# Patient Record
Sex: Male | Born: 1975 | Race: White | Hispanic: No | Marital: Married | Smoking: Never smoker
Health system: Southern US, Community
[De-identification: ages and names within clinical notes are randomized; demographics above are authoritative.]

## PROBLEM LIST (undated history)

## (undated) DIAGNOSIS — R0602 Shortness of breath: Secondary | ICD-10-CM

## (undated) DIAGNOSIS — G809 Cerebral palsy, unspecified: Secondary | ICD-10-CM

## (undated) DIAGNOSIS — J189 Pneumonia, unspecified organism: Secondary | ICD-10-CM

## (undated) DIAGNOSIS — M1990 Unspecified osteoarthritis, unspecified site: Secondary | ICD-10-CM

## (undated) DIAGNOSIS — G8929 Other chronic pain: Secondary | ICD-10-CM

## (undated) DIAGNOSIS — K219 Gastro-esophageal reflux disease without esophagitis: Secondary | ICD-10-CM

## (undated) DIAGNOSIS — M549 Dorsalgia, unspecified: Secondary | ICD-10-CM

## (undated) DIAGNOSIS — N200 Calculus of kidney: Secondary | ICD-10-CM

## (undated) DIAGNOSIS — G709 Myoneural disorder, unspecified: Secondary | ICD-10-CM

## (undated) DIAGNOSIS — K649 Unspecified hemorrhoids: Secondary | ICD-10-CM

## (undated) DIAGNOSIS — G47419 Narcolepsy without cataplexy: Secondary | ICD-10-CM

## (undated) DIAGNOSIS — S060X9A Concussion with loss of consciousness of unspecified duration, initial encounter: Secondary | ICD-10-CM

## (undated) DIAGNOSIS — I639 Cerebral infarction, unspecified: Secondary | ICD-10-CM

## (undated) DIAGNOSIS — G43909 Migraine, unspecified, not intractable, without status migrainosus: Secondary | ICD-10-CM

## (undated) DIAGNOSIS — S060XAA Concussion with loss of consciousness status unknown, initial encounter: Secondary | ICD-10-CM

## (undated) DIAGNOSIS — G40909 Epilepsy, unspecified, not intractable, without status epilepticus: Secondary | ICD-10-CM

## (undated) HISTORY — PX: TENDON LENGTHENING: SHX395

## (undated) HISTORY — PX: APPENDECTOMY: SHX54

---

## 2005-09-29 HISTORY — PX: VASECTOMY: SHX75

## 2011-10-08 DIAGNOSIS — Z79899 Other long term (current) drug therapy: Secondary | ICD-10-CM | POA: Diagnosis not present

## 2011-10-08 DIAGNOSIS — M542 Cervicalgia: Secondary | ICD-10-CM | POA: Diagnosis not present

## 2011-10-08 DIAGNOSIS — S22009A Unspecified fracture of unspecified thoracic vertebra, initial encounter for closed fracture: Secondary | ICD-10-CM | POA: Diagnosis not present

## 2011-10-08 DIAGNOSIS — S139XXA Sprain of joints and ligaments of unspecified parts of neck, initial encounter: Secondary | ICD-10-CM | POA: Diagnosis not present

## 2011-10-08 DIAGNOSIS — R569 Unspecified convulsions: Secondary | ICD-10-CM | POA: Diagnosis not present

## 2011-10-09 DIAGNOSIS — S22009A Unspecified fracture of unspecified thoracic vertebra, initial encounter for closed fracture: Secondary | ICD-10-CM | POA: Diagnosis not present

## 2011-10-09 DIAGNOSIS — M545 Low back pain: Secondary | ICD-10-CM | POA: Diagnosis not present

## 2011-12-19 DIAGNOSIS — IMO0002 Reserved for concepts with insufficient information to code with codable children: Secondary | ICD-10-CM | POA: Diagnosis not present

## 2012-01-06 DIAGNOSIS — M546 Pain in thoracic spine: Secondary | ICD-10-CM | POA: Diagnosis not present

## 2012-03-22 DIAGNOSIS — G40209 Localization-related (focal) (partial) symptomatic epilepsy and epileptic syndromes with complex partial seizures, not intractable, without status epilepticus: Secondary | ICD-10-CM | POA: Diagnosis not present

## 2012-03-22 DIAGNOSIS — G43009 Migraine without aura, not intractable, without status migrainosus: Secondary | ICD-10-CM | POA: Diagnosis not present

## 2012-03-22 DIAGNOSIS — I6330 Cerebral infarction due to thrombosis of unspecified cerebral artery: Secondary | ICD-10-CM | POA: Diagnosis not present

## 2012-03-24 DIAGNOSIS — G40209 Localization-related (focal) (partial) symptomatic epilepsy and epileptic syndromes with complex partial seizures, not intractable, without status epilepticus: Secondary | ICD-10-CM | POA: Diagnosis not present

## 2012-03-24 DIAGNOSIS — I6330 Cerebral infarction due to thrombosis of unspecified cerebral artery: Secondary | ICD-10-CM | POA: Diagnosis not present

## 2012-03-24 DIAGNOSIS — G43009 Migraine without aura, not intractable, without status migrainosus: Secondary | ICD-10-CM | POA: Diagnosis not present

## 2012-06-09 DIAGNOSIS — M546 Pain in thoracic spine: Secondary | ICD-10-CM | POA: Diagnosis not present

## 2012-07-03 DIAGNOSIS — R110 Nausea: Secondary | ICD-10-CM | POA: Diagnosis not present

## 2012-07-03 DIAGNOSIS — M5090 Cervical disc disorder, unspecified, unspecified cervical region: Secondary | ICD-10-CM | POA: Diagnosis not present

## 2012-07-06 DIAGNOSIS — M542 Cervicalgia: Secondary | ICD-10-CM | POA: Diagnosis not present

## 2012-07-06 DIAGNOSIS — M546 Pain in thoracic spine: Secondary | ICD-10-CM | POA: Diagnosis not present

## 2012-07-06 DIAGNOSIS — M62838 Other muscle spasm: Secondary | ICD-10-CM | POA: Diagnosis not present

## 2012-07-06 DIAGNOSIS — G894 Chronic pain syndrome: Secondary | ICD-10-CM | POA: Diagnosis not present

## 2012-07-26 DIAGNOSIS — Z0000 Encounter for general adult medical examination without abnormal findings: Secondary | ICD-10-CM | POA: Diagnosis not present

## 2012-08-03 DIAGNOSIS — M629 Disorder of muscle, unspecified: Secondary | ICD-10-CM | POA: Diagnosis not present

## 2012-08-03 DIAGNOSIS — M546 Pain in thoracic spine: Secondary | ICD-10-CM | POA: Diagnosis not present

## 2012-08-03 DIAGNOSIS — G894 Chronic pain syndrome: Secondary | ICD-10-CM | POA: Diagnosis not present

## 2012-08-03 DIAGNOSIS — M542 Cervicalgia: Secondary | ICD-10-CM | POA: Diagnosis not present

## 2012-08-09 DIAGNOSIS — M542 Cervicalgia: Secondary | ICD-10-CM | POA: Diagnosis not present

## 2012-08-17 DIAGNOSIS — M542 Cervicalgia: Secondary | ICD-10-CM | POA: Diagnosis not present

## 2012-08-23 DIAGNOSIS — M25569 Pain in unspecified knee: Secondary | ICD-10-CM | POA: Diagnosis not present

## 2012-08-23 DIAGNOSIS — M6580 Other synovitis and tenosynovitis, unspecified site: Secondary | ICD-10-CM | POA: Diagnosis not present

## 2012-08-25 DIAGNOSIS — M25529 Pain in unspecified elbow: Secondary | ICD-10-CM | POA: Diagnosis not present

## 2012-08-25 DIAGNOSIS — M25519 Pain in unspecified shoulder: Secondary | ICD-10-CM | POA: Diagnosis not present

## 2012-09-01 DIAGNOSIS — M47817 Spondylosis without myelopathy or radiculopathy, lumbosacral region: Secondary | ICD-10-CM | POA: Diagnosis not present

## 2012-09-01 DIAGNOSIS — M542 Cervicalgia: Secondary | ICD-10-CM | POA: Diagnosis not present

## 2012-10-13 DIAGNOSIS — M5030 Other cervical disc degeneration, unspecified cervical region: Secondary | ICD-10-CM | POA: Diagnosis not present

## 2012-10-13 DIAGNOSIS — G894 Chronic pain syndrome: Secondary | ICD-10-CM | POA: Diagnosis not present

## 2012-10-13 DIAGNOSIS — M546 Pain in thoracic spine: Secondary | ICD-10-CM | POA: Diagnosis not present

## 2012-10-13 DIAGNOSIS — M545 Low back pain: Secondary | ICD-10-CM | POA: Diagnosis not present

## 2012-11-29 DIAGNOSIS — H40059 Ocular hypertension, unspecified eye: Secondary | ICD-10-CM | POA: Diagnosis not present

## 2012-12-28 DIAGNOSIS — M5030 Other cervical disc degeneration, unspecified cervical region: Secondary | ICD-10-CM | POA: Diagnosis not present

## 2013-01-04 DIAGNOSIS — M25519 Pain in unspecified shoulder: Secondary | ICD-10-CM | POA: Diagnosis not present

## 2013-01-17 DIAGNOSIS — M25519 Pain in unspecified shoulder: Secondary | ICD-10-CM | POA: Diagnosis not present

## 2013-01-18 ENCOUNTER — Encounter (HOSPITAL_COMMUNITY): Payer: Self-pay | Admitting: Pharmacy Technician

## 2013-01-18 DIAGNOSIS — M47812 Spondylosis without myelopathy or radiculopathy, cervical region: Principal | ICD-10-CM | POA: Diagnosis present

## 2013-01-18 NOTE — H&P (Signed)
History of Present Illness The patient is a 37 year old male who presents with neck pain. The patient reports symptoms involving the entire neck which began 1 year(s) ago. The symptoms began following a specific injury. The injury occurred due to a fall. Symptoms include neck pain, shoulder pain and arm numbness. The pain radiates to the left shoulder, left arm, right shoulder and right arm. The patient describes the pain as sharp and throbbing. Onset was gradual. The symptoms occur constantly.The patient does feel that the symptoms are worsening. Symptoms are exacerbated by neck movement. Symptoms are not relieved by ice or heat. Current treatment includes muscle relaxants. Prior to being seen today the patient was previously evaluated by a colleague (Dr. Ethelene Hal). Past evaluation has included cervical spine MRI. Past treatment has included spinal injections.  Subjective: Kristopher Valdez is a very pleasant 37 YO gentleman with an underlying history of cerebral palsy and seizure disorders. He has been having progressive midline neck pain, bilateral trapezial pain and left proximal arm pain for years. He's had previous shoulder dislocations on the left side. As a result of the persistent nature, he went to see Dr. Ethelene Hal. He's had P.T. in the past for his shoulders and his low back that only increased his pain. As a result, he had a cervical epidural steroid injection as well as cervical trigger point injections. He states they helped for a very short period of time (one day) so he was referred to me after the MRI was obtained.   He had an MRI done on 08-09-12 which demonstrated a moderate broad-based central disc protrusion on the left side at C4-5 causing displacement and compression of the left C5, and even somewhat the right C5 root.    Allergies No Known Allergies. 12/19/2011   Family History Cancer. grandmother mothers side Diabetes Mellitus. grandmother mothers side Heart Disease.  grandfather mothers side Heart disease in male family member before age 30 Hypertension. mother, father and grandfather mothers side Rheumatoid Arthritis. mother and grandmother mothers side   Social History Tobacco use. never smoker Marital status. married Drug/Alcohol Rehab (Currently). no Tobacco / smoke exposure. no Pain Contract. no Number of flights of stairs before winded. greater than 5 Living situation. live with spouse Illicit drug use. no Exercise. Exercises weekly; does other Current work status. disabled Children. 2 Drug/Alcohol Rehab (Previously). no   Medication History Robaxin-750 (750MG  Tablet, 1 (one) Tablet Oral two times daily, as needed, Taken starting 12/01/2012) Active. Percocet (10-325MG  Tablet, 1 (one) Tablet Oral every six hours, as needed, Taken starting 12/01/2012) Active. Depakote ER (500MG  Tablet ER 24HR, 3 Oral daily) Active. Vitamin C (500MG  Tablet, 1 (one) Oral as needed) Active.   Past Surgical History Appendectomy Other Orthopaedic Surgery Vasectomy Ankle Surgery. right   Other Problems Migraine Headache Other disease, cancer, significant illness Seizure Disorder Sleep Apnea  Exam:  Clinically he continues to have severe posterior neck pain, radiation into the scapula, and radiation over the lateral aspect of the upper arm and all of this is on the left side.  On clinical exam he is a pleasant gentleman who appears his started age in no acute distress. He is alert and oriented times three. No shortness of breath or chest pain. The lungs are clear to auscultation. No history of incontinence of bowel and bladder. Normal gait pattern. Negative Babinski test. Negative Hoffman's sign. No clonus. He has significant lateral deltoid pain with slight decreased sensation to light touch over the C5 dermatome. He has 4/5 deltoid strength  on the left side compared to the right. The rest of his left upper extremity is  within normal limits. He does have underlying CP, which affects his right side more prominently than his left, but there is no change on his right baseline exam.  RADIOGRAPHS:  The patient's MRI from November clearly shows C4-5 disc pathology with C5 left nerve root compression.  Although there is significant shoulder pathology, clinically he relates the largest percentage of his pain to his cervical C5 radiculopathy.    Plans Transcription(Kristopher Patella D Kristopher Coulon, MD; 12/31/2012 7:49 AM)  At this point in time, given the history of the recurrent dislocations and the pain with isolated shoulder ROM, before recommending any intervention for his neck, I'd like to at least get a better picture of his shoulder. I will order an MRI of his left shoulder to rule out a rotator cuff tear. Once we know the true extent of his pathology, then we can discuss potential intervention for his neck.  We have talked today about surgical intervention. He has had injection therapy, physical therapy, observation, and medications. His pain has gotten progressively worse since 2012 fall. At this point having tried and failed prolonged conservative management he would like to proceed with surgery. I think the best course of action is a single level ACDF.  The risks of that include infection, bleeding, nerve damage, death, stoke, paralysis, failure to heal, need for further surgery, ongoing or worse pain, nonunion, throat pain, swallowing difficulties, hoarseness in the voice, need for supplemental posterior surgery, also risk of adjacent segment problems, loss of bowel and bladder control, blood clots, adjacent segment disease.  All of his questions were addressed. We will plan on surgery in the near future.

## 2013-01-19 DIAGNOSIS — M542 Cervicalgia: Secondary | ICD-10-CM | POA: Diagnosis not present

## 2013-01-21 ENCOUNTER — Encounter (HOSPITAL_COMMUNITY)
Admission: RE | Admit: 2013-01-21 | Discharge: 2013-01-21 | Disposition: A | Discharge status: Home / Self Care | Payer: Medicare Other | Source: Ambulatory Visit | Attending: Orthopedic Surgery | Admitting: Orthopedic Surgery

## 2013-01-21 ENCOUNTER — Encounter (HOSPITAL_COMMUNITY): Payer: Self-pay

## 2013-01-21 DIAGNOSIS — M5020 Other cervical disc displacement, unspecified cervical region: Secondary | ICD-10-CM | POA: Diagnosis not present

## 2013-01-21 DIAGNOSIS — Z8261 Family history of arthritis: Secondary | ICD-10-CM | POA: Diagnosis not present

## 2013-01-21 DIAGNOSIS — Z9852 Vasectomy status: Secondary | ICD-10-CM | POA: Diagnosis not present

## 2013-01-21 DIAGNOSIS — Z01812 Encounter for preprocedural laboratory examination: Secondary | ICD-10-CM | POA: Diagnosis not present

## 2013-01-21 DIAGNOSIS — Z8249 Family history of ischemic heart disease and other diseases of the circulatory system: Secondary | ICD-10-CM | POA: Diagnosis not present

## 2013-01-21 DIAGNOSIS — Z79899 Other long term (current) drug therapy: Secondary | ICD-10-CM | POA: Diagnosis not present

## 2013-01-21 DIAGNOSIS — R0602 Shortness of breath: Secondary | ICD-10-CM | POA: Diagnosis not present

## 2013-01-21 DIAGNOSIS — M47812 Spondylosis without myelopathy or radiculopathy, cervical region: Secondary | ICD-10-CM | POA: Diagnosis not present

## 2013-01-21 DIAGNOSIS — G809 Cerebral palsy, unspecified: Secondary | ICD-10-CM | POA: Diagnosis not present

## 2013-01-21 DIAGNOSIS — M5000 Cervical disc disorder with myelopathy, unspecified cervical region: Secondary | ICD-10-CM | POA: Diagnosis not present

## 2013-01-21 DIAGNOSIS — G4730 Sleep apnea, unspecified: Secondary | ICD-10-CM | POA: Diagnosis present

## 2013-01-21 DIAGNOSIS — G8929 Other chronic pain: Secondary | ICD-10-CM | POA: Diagnosis present

## 2013-01-21 DIAGNOSIS — Z01818 Encounter for other preprocedural examination: Secondary | ICD-10-CM | POA: Diagnosis not present

## 2013-01-21 DIAGNOSIS — G43909 Migraine, unspecified, not intractable, without status migrainosus: Secondary | ICD-10-CM | POA: Diagnosis present

## 2013-01-21 DIAGNOSIS — Z8673 Personal history of transient ischemic attack (TIA), and cerebral infarction without residual deficits: Secondary | ICD-10-CM | POA: Diagnosis not present

## 2013-01-21 DIAGNOSIS — M539 Dorsopathy, unspecified: Secondary | ICD-10-CM | POA: Diagnosis not present

## 2013-01-21 DIAGNOSIS — G40909 Epilepsy, unspecified, not intractable, without status epilepticus: Secondary | ICD-10-CM | POA: Diagnosis not present

## 2013-01-21 DIAGNOSIS — Z833 Family history of diabetes mellitus: Secondary | ICD-10-CM | POA: Diagnosis not present

## 2013-01-21 DIAGNOSIS — K219 Gastro-esophageal reflux disease without esophagitis: Secondary | ICD-10-CM | POA: Diagnosis not present

## 2013-01-21 DIAGNOSIS — Z981 Arthrodesis status: Secondary | ICD-10-CM | POA: Diagnosis not present

## 2013-01-21 HISTORY — DX: Unspecified hemorrhoids: K64.9

## 2013-01-21 HISTORY — DX: Narcolepsy without cataplexy: G47.419

## 2013-01-21 HISTORY — DX: Concussion with loss of consciousness status unknown, initial encounter: S06.0XAA

## 2013-01-21 HISTORY — DX: Unspecified osteoarthritis, unspecified site: M19.90

## 2013-01-21 HISTORY — DX: Cerebral palsy, unspecified: G80.9

## 2013-01-21 HISTORY — DX: Gastro-esophageal reflux disease without esophagitis: K21.9

## 2013-01-21 HISTORY — DX: Shortness of breath: R06.02

## 2013-01-21 HISTORY — DX: Myoneural disorder, unspecified: G70.9

## 2013-01-21 HISTORY — DX: Cerebral infarction, unspecified: I63.9

## 2013-01-21 HISTORY — DX: Concussion with loss of consciousness of unspecified duration, initial encounter: S06.0X9A

## 2013-01-21 LAB — CBC
HCT: 42.1 % (ref 39.0–52.0)
MCHC: 35.2 g/dL (ref 30.0–36.0)
Platelets: 191 10*3/uL (ref 150–400)
RDW: 11.7 % (ref 11.5–15.5)
WBC: 5.7 10*3/uL (ref 4.0–10.5)

## 2013-01-21 LAB — SURGICAL PCR SCREEN
MRSA, PCR: NEGATIVE
Staphylococcus aureus: POSITIVE — AB

## 2013-01-21 NOTE — Pre-Procedure Instructions (Addendum)
CHRISTEN BEDOYA  01/21/2013   Your procedure is scheduled on:  Wednesday, April 30th.  Report to [facility] at 9:30AM.  Call this number if you have problems the morning of surgery: [phone]   Remember:   Do not eat food or drink liquids after midnight.   Take these medicines the morning of surgery with A SIP OF WATER:  divalproex (DEPAKOTE  If needed: HYDROcodone-acetaminophen (NORCO) methocarbamol (ROBAXIN)   oxyCODONE-acetaminophen (PERCOCET)     Do not wear jewelry, make-up or nail polish.  Do not wear lotions, powders, or perfumes. You may wear deodorant.  Men may shave face and neck.  Do not bring valuables to the hospital.  Contacts, dentures or bridgework may not be worn into surgery.  Leave suitcase in the car. After surgery it may be brought to your room.  For patients admitted to the hospital, checkout time is 11:00 AM the day of discharge.   Patients discharged the day of surgery will not be allowed to drive home.  Name and phone number of your driver: -  Special Instructions: Shower using CHG 2 nights before surgery and the night before surgery.  If you shower the day of surgery use CHG.  Use special wash - you have one bottle of CHG for all showers.  You should use approximately 1/3 of the bottle for each shower.  Bring Brace with you to the hospital.   Please read over the following fact sheets that you were given: Pain Booklet, Coughing and Deep Breathing and Surgical Site Infection Prevention

## 2013-01-25 MED ORDER — CEFAZOLIN SODIUM-DEXTROSE 2-3 GM-% IV SOLR
2.0000 g | INTRAVENOUS | Status: AC
  Administered 2013-01-26: 2 g via INTRAVENOUS
  Filled 2013-01-25: qty 50

## 2013-01-25 MED ORDER — DEXAMETHASONE SODIUM PHOSPHATE 4 MG/ML IJ SOLN
4.0000 mg | Freq: Once | INTRAMUSCULAR | Status: DC
  Filled 2013-01-25: qty 1

## 2013-01-25 MED ORDER — ACETAMINOPHEN 10 MG/ML IV SOLN
1000.0000 mg | Freq: Once | INTRAVENOUS | Status: AC
  Administered 2013-01-26: 1000 mg via INTRAVENOUS
  Filled 2013-01-25: qty 100

## 2013-01-26 ENCOUNTER — Ambulatory Visit (HOSPITAL_COMMUNITY): Payer: Medicare Other

## 2013-01-26 ENCOUNTER — Encounter (HOSPITAL_COMMUNITY): Payer: Self-pay | Admitting: Anesthesiology

## 2013-01-26 ENCOUNTER — Ambulatory Visit (HOSPITAL_COMMUNITY): Payer: Medicare Other | Admitting: Anesthesiology

## 2013-01-26 ENCOUNTER — Inpatient Hospital Stay (HOSPITAL_COMMUNITY)
Admission: RE | Admit: 2013-01-26 | Discharge: 2013-01-27 | DRG: 473 | Disposition: A | Discharge status: Home / Self Care | Payer: Medicare Other | Source: Ambulatory Visit | Attending: Orthopedic Surgery | Admitting: Orthopedic Surgery

## 2013-01-26 ENCOUNTER — Encounter (HOSPITAL_COMMUNITY)
Admission: RE | Disposition: A | Discharge status: Home / Self Care | Payer: Self-pay | Source: Ambulatory Visit | Attending: Orthopedic Surgery

## 2013-01-26 ENCOUNTER — Encounter (HOSPITAL_COMMUNITY): Payer: Self-pay | Admitting: *Deleted

## 2013-01-26 DIAGNOSIS — G40909 Epilepsy, unspecified, not intractable, without status epilepticus: Secondary | ICD-10-CM | POA: Diagnosis present

## 2013-01-26 DIAGNOSIS — M47812 Spondylosis without myelopathy or radiculopathy, cervical region: Principal | ICD-10-CM | POA: Diagnosis present

## 2013-01-26 DIAGNOSIS — Z833 Family history of diabetes mellitus: Secondary | ICD-10-CM

## 2013-01-26 DIAGNOSIS — M539 Dorsopathy, unspecified: Secondary | ICD-10-CM | POA: Diagnosis not present

## 2013-01-26 DIAGNOSIS — M5000 Cervical disc disorder with myelopathy, unspecified cervical region: Secondary | ICD-10-CM | POA: Diagnosis not present

## 2013-01-26 DIAGNOSIS — R0602 Shortness of breath: Secondary | ICD-10-CM | POA: Diagnosis not present

## 2013-01-26 DIAGNOSIS — G8929 Other chronic pain: Secondary | ICD-10-CM | POA: Diagnosis present

## 2013-01-26 DIAGNOSIS — K219 Gastro-esophageal reflux disease without esophagitis: Secondary | ICD-10-CM | POA: Diagnosis not present

## 2013-01-26 DIAGNOSIS — Z79899 Other long term (current) drug therapy: Secondary | ICD-10-CM

## 2013-01-26 DIAGNOSIS — G809 Cerebral palsy, unspecified: Secondary | ICD-10-CM | POA: Diagnosis not present

## 2013-01-26 DIAGNOSIS — G4730 Sleep apnea, unspecified: Secondary | ICD-10-CM | POA: Diagnosis present

## 2013-01-26 DIAGNOSIS — Z01818 Encounter for other preprocedural examination: Secondary | ICD-10-CM | POA: Diagnosis not present

## 2013-01-26 DIAGNOSIS — Z8673 Personal history of transient ischemic attack (TIA), and cerebral infarction without residual deficits: Secondary | ICD-10-CM

## 2013-01-26 DIAGNOSIS — Z981 Arthrodesis status: Secondary | ICD-10-CM | POA: Diagnosis not present

## 2013-01-26 DIAGNOSIS — Z8261 Family history of arthritis: Secondary | ICD-10-CM

## 2013-01-26 DIAGNOSIS — Z01812 Encounter for preprocedural laboratory examination: Secondary | ICD-10-CM

## 2013-01-26 DIAGNOSIS — Z8249 Family history of ischemic heart disease and other diseases of the circulatory system: Secondary | ICD-10-CM

## 2013-01-26 DIAGNOSIS — M5020 Other cervical disc displacement, unspecified cervical region: Secondary | ICD-10-CM | POA: Diagnosis not present

## 2013-01-26 DIAGNOSIS — Z9852 Vasectomy status: Secondary | ICD-10-CM

## 2013-01-26 DIAGNOSIS — G43909 Migraine, unspecified, not intractable, without status migrainosus: Secondary | ICD-10-CM | POA: Diagnosis present

## 2013-01-26 HISTORY — DX: Migraine, unspecified, not intractable, without status migrainosus: G43.909

## 2013-01-26 HISTORY — DX: Epilepsy, unspecified, not intractable, without status epilepticus: G40.909

## 2013-01-26 HISTORY — DX: Calculus of kidney: N20.0

## 2013-01-26 HISTORY — PX: ANTERIOR CERVICAL DECOMP/DISCECTOMY FUSION: SHX1161

## 2013-01-26 HISTORY — DX: Pneumonia, unspecified organism: J18.9

## 2013-01-26 HISTORY — DX: Dorsalgia, unspecified: M54.9

## 2013-01-26 HISTORY — DX: Other chronic pain: G89.29

## 2013-01-26 LAB — BASIC METABOLIC PANEL
CO2: 27 mEq/L (ref 19–32)
GFR calc non Af Amer: >90 mL/min (ref >90)
Glucose, Bld: 88 mg/dL (ref 70–99)
Potassium: 4.4 mEq/L (ref 3.5–5.1)
Sodium: 138 mEq/L (ref 135–145)

## 2013-01-26 SURGERY — ANTERIOR CERVICAL DECOMPRESSION/DISCECTOMY FUSION 1 LEVEL
Anesthesia: General | Site: Neck

## 2013-01-26 MED ORDER — DEXAMETHASONE SODIUM PHOSPHATE 4 MG/ML IJ SOLN
4.0000 mg | Freq: Four times a day (QID) | INTRAMUSCULAR | Status: DC
  Filled 2013-01-26 (×7): qty 1

## 2013-01-26 MED ORDER — OXYCODONE-ACETAMINOPHEN 10-325 MG PO TABS: 1.0000 | ORAL_TABLET | ORAL | Status: DC | PRN

## 2013-01-26 MED ORDER — METHOCARBAMOL 500 MG PO TABS: 500.0000 mg | ORAL_TABLET | Freq: Three times a day (TID) | ORAL | Status: DC | PRN

## 2013-01-26 MED ORDER — MORPHINE SULFATE 2 MG/ML IJ SOLN
1.0000 mg | INTRAMUSCULAR | Status: DC | PRN
  Administered 2013-01-26: 2 mg via INTRAVENOUS
  Filled 2013-01-26: qty 1

## 2013-01-26 MED ORDER — MIDAZOLAM HCL 5 MG/5ML IJ SOLN
INTRAMUSCULAR | Status: DC | PRN
  Administered 2013-01-26: 2 mg via INTRAVENOUS

## 2013-01-26 MED ORDER — DEXAMETHASONE SODIUM PHOSPHATE 10 MG/ML IJ SOLN
INTRAMUSCULAR | Status: AC
  Administered 2013-01-26: 10 mg via INTRAVENOUS
  Filled 2013-01-26: qty 1

## 2013-01-26 MED ORDER — NEOSTIGMINE METHYLSULFATE 1 MG/ML IJ SOLN
INTRAMUSCULAR | Status: DC | PRN
  Administered 2013-01-26: 3 mg via INTRAVENOUS

## 2013-01-26 MED ORDER — LACTATED RINGERS IV SOLN
INTRAVENOUS | Status: DC
  Administered 2013-01-26: 11:00:00 via INTRAVENOUS

## 2013-01-26 MED ORDER — ONDANSETRON HCL 4 MG PO TABS: 4.0000 mg | ORAL_TABLET | Freq: Three times a day (TID) | ORAL | Status: DC | PRN

## 2013-01-26 MED ORDER — POLYETHYLENE GLYCOL 3350 17 GM/SCOOP PO POWD: 17.0000 g | Freq: Every day | ORAL | Status: DC

## 2013-01-26 MED ORDER — 0.9 % SODIUM CHLORIDE (POUR BTL) OPTIME
TOPICAL | Status: DC | PRN
  Administered 2013-01-26: 1000 mL

## 2013-01-26 MED ORDER — BUPIVACAINE-EPINEPHRINE 0.25% -1:200000 IJ SOLN
INTRAMUSCULAR | Status: DC | PRN
  Administered 2013-01-26: 3 mL

## 2013-01-26 MED ORDER — ROCURONIUM BROMIDE 100 MG/10ML IV SOLN
INTRAVENOUS | Status: DC | PRN
  Administered 2013-01-26: 50 mg via INTRAVENOUS

## 2013-01-26 MED ORDER — HYDROMORPHONE HCL PF 1 MG/ML IJ SOLN
INTRAMUSCULAR | Status: AC
  Filled 2013-01-26: qty 1

## 2013-01-26 MED ORDER — SODIUM CHLORIDE 0.9 % IJ SOLN
3.0000 mL | Freq: Two times a day (BID) | INTRAMUSCULAR | Status: DC
  Administered 2013-01-27: 3 mL via INTRAVENOUS

## 2013-01-26 MED ORDER — LACTATED RINGERS IV SOLN: INTRAVENOUS | Status: DC

## 2013-01-26 MED ORDER — SODIUM CHLORIDE 0.9 % IJ SOLN: 3.0000 mL | INTRAMUSCULAR | Status: DC | PRN

## 2013-01-26 MED ORDER — GLYCOPYRROLATE 0.2 MG/ML IJ SOLN
INTRAMUSCULAR | Status: DC | PRN
  Administered 2013-01-26: 0.4 mg via INTRAVENOUS

## 2013-01-26 MED ORDER — OXYCODONE HCL 5 MG/5ML PO SOLN: 5.0000 mg | Freq: Once | ORAL | Status: DC | PRN

## 2013-01-26 MED ORDER — HYDROMORPHONE HCL PF 1 MG/ML IJ SOLN
0.2500 mg | INTRAMUSCULAR | Status: DC | PRN
  Administered 2013-01-26 (×2): 0.5 mg via INTRAVENOUS

## 2013-01-26 MED ORDER — ONDANSETRON HCL 4 MG/2ML IJ SOLN
INTRAMUSCULAR | Status: DC | PRN
  Administered 2013-01-26: 4 mg via INTRAVENOUS

## 2013-01-26 MED ORDER — PHENYLEPHRINE HCL 10 MG/ML IJ SOLN
INTRAMUSCULAR | Status: DC | PRN
  Administered 2013-01-26 (×2): 80 ug via INTRAVENOUS

## 2013-01-26 MED ORDER — LIDOCAINE HCL (CARDIAC) 20 MG/ML IV SOLN
INTRAVENOUS | Status: DC | PRN
  Administered 2013-01-26: 50 mg via INTRAVENOUS

## 2013-01-26 MED ORDER — THROMBIN 20000 UNITS EX SOLR
OROMUCOSAL | Status: DC | PRN
  Administered 2013-01-26: 12:00:00 via TOPICAL

## 2013-01-26 MED ORDER — METOCLOPRAMIDE HCL 5 MG/ML IJ SOLN: 10.0000 mg | Freq: Once | INTRAMUSCULAR | Status: DC | PRN

## 2013-01-26 MED ORDER — ACETAMINOPHEN 10 MG/ML IV SOLN
INTRAVENOUS | Status: AC
  Filled 2013-01-26: qty 100

## 2013-01-26 MED ORDER — FENTANYL CITRATE 0.05 MG/ML IJ SOLN
INTRAMUSCULAR | Status: DC | PRN
  Administered 2013-01-26: 50 ug via INTRAVENOUS
  Administered 2013-01-26: 100 ug via INTRAVENOUS
  Administered 2013-01-26: 50 ug via INTRAVENOUS

## 2013-01-26 MED ORDER — THROMBIN 20000 UNITS EX SOLR
CUTANEOUS | Status: DC | PRN
  Administered 2013-01-26: 20000 [IU] via TOPICAL

## 2013-01-26 MED ORDER — OXYCODONE HCL 5 MG PO TABS: 5.0000 mg | ORAL_TABLET | Freq: Once | ORAL | Status: DC | PRN

## 2013-01-26 MED ORDER — ZOLPIDEM TARTRATE 5 MG PO TABS: 5.0000 mg | ORAL_TABLET | Freq: Every evening | ORAL | Status: DC | PRN

## 2013-01-26 MED ORDER — THROMBIN 20000 UNITS EX SOLR
CUTANEOUS | Status: AC
  Filled 2013-01-26: qty 20000

## 2013-01-26 MED ORDER — CEFAZOLIN SODIUM 1-5 GM-% IV SOLN
1.0000 g | Freq: Three times a day (TID) | INTRAVENOUS | Status: AC
  Administered 2013-01-26 – 2013-01-27 (×2): 1 g via INTRAVENOUS
  Filled 2013-01-26 (×3): qty 50

## 2013-01-26 MED ORDER — LACTATED RINGERS IV SOLN
INTRAVENOUS | Status: DC | PRN
  Administered 2013-01-26 (×2): via INTRAVENOUS

## 2013-01-26 MED ORDER — BUPIVACAINE-EPINEPHRINE PF 0.25-1:200000 % IJ SOLN
INTRAMUSCULAR | Status: AC
  Filled 2013-01-26: qty 30

## 2013-01-26 MED ORDER — LIDOCAINE HCL 4 % MT SOLN
OROMUCOSAL | Status: DC | PRN
  Administered 2013-01-26: 4 mL via TOPICAL

## 2013-01-26 MED ORDER — EPHEDRINE SULFATE 50 MG/ML IJ SOLN
INTRAMUSCULAR | Status: DC | PRN
  Administered 2013-01-26: 10 mg via INTRAVENOUS

## 2013-01-26 MED ORDER — SODIUM CHLORIDE 0.9 % IV SOLN: 250.0000 mL | INTRAVENOUS | Status: DC

## 2013-01-26 MED ORDER — DEXAMETHASONE 4 MG PO TABS
4.0000 mg | ORAL_TABLET | Freq: Four times a day (QID) | ORAL | Status: DC
  Administered 2013-01-26 – 2013-01-27 (×4): 4 mg via ORAL
  Filled 2013-01-26 (×7): qty 1

## 2013-01-26 MED ORDER — OXYCODONE HCL 5 MG PO TABS
10.0000 mg | ORAL_TABLET | ORAL | Status: DC | PRN
  Administered 2013-01-27: 10 mg via ORAL
  Filled 2013-01-26: qty 2

## 2013-01-26 MED ORDER — PROPOFOL 10 MG/ML IV BOLUS
INTRAVENOUS | Status: DC | PRN
  Administered 2013-01-26: 200 mg via INTRAVENOUS

## 2013-01-26 MED ORDER — DIVALPROEX SODIUM 500 MG PO DR TAB
500.0000 mg | DELAYED_RELEASE_TABLET | Freq: Three times a day (TID) | ORAL | Status: DC
  Administered 2013-01-26 – 2013-01-27 (×2): 500 mg via ORAL
  Filled 2013-01-26 (×4): qty 1

## 2013-01-26 MED ORDER — METHOCARBAMOL 100 MG/ML IJ SOLN
500.0000 mg | Freq: Four times a day (QID) | INTRAVENOUS | Status: DC | PRN
  Filled 2013-01-26: qty 5

## 2013-01-26 MED ORDER — ACETAMINOPHEN 10 MG/ML IV SOLN
1000.0000 mg | Freq: Four times a day (QID) | INTRAVENOUS | Status: DC
Start: 2013-01-26 — End: 2013-01-27
  Administered 2013-01-26 – 2013-01-27 (×3): 1000 mg via INTRAVENOUS
  Filled 2013-01-26 (×4): qty 100

## 2013-01-26 MED ORDER — PHENOL 1.4 % MT LIQD
1.0000 | OROMUCOSAL | Status: DC | PRN
  Administered 2013-01-27: 1 via OROMUCOSAL
  Filled 2013-01-26: qty 177

## 2013-01-26 MED ORDER — DOCUSATE SODIUM 100 MG PO CAPS: 100.0000 mg | ORAL_CAPSULE | Freq: Three times a day (TID) | ORAL | Status: DC | PRN

## 2013-01-26 MED ORDER — MENTHOL 3 MG MT LOZG: 1.0000 | LOZENGE | OROMUCOSAL | Status: DC | PRN

## 2013-01-26 MED ORDER — ONDANSETRON HCL 4 MG/2ML IJ SOLN
4.0000 mg | INTRAMUSCULAR | Status: DC | PRN
  Administered 2013-01-26: 4 mg via INTRAVENOUS
  Filled 2013-01-26: qty 2

## 2013-01-26 MED ORDER — METHOCARBAMOL 500 MG PO TABS
500.0000 mg | ORAL_TABLET | Freq: Four times a day (QID) | ORAL | Status: DC | PRN
  Administered 2013-01-27: 500 mg via ORAL
  Filled 2013-01-26: qty 1

## 2013-01-26 MED ORDER — MIDAZOLAM HCL 5 MG/5ML IJ SOLN: INTRAMUSCULAR | Status: DC | PRN

## 2013-01-26 SURGICAL SUPPLY — 53 items
ACIS DISTRACTION PINS 12MM ×4 IMPLANT
BLADE SURG ROTATE 9660 (MISCELLANEOUS) IMPLANT
BUR EGG ELITE 4.0 (BURR) IMPLANT
BUR MATCHSTICK NEURO 3.0 LAGG (BURR) IMPLANT
CANISTER SUCTION 2500CC (MISCELLANEOUS) ×2 IMPLANT
CLOTH BEACON ORANGE TIMEOUT ST (SAFETY) ×2 IMPLANT
CLSR STERI-STRIP ANTIMIC 1/2X4 (GAUZE/BANDAGES/DRESSINGS) ×2 IMPLANT
CORDS BIPOLAR (ELECTRODE) ×2 IMPLANT
COVER SURGICAL LIGHT HANDLE (MISCELLANEOUS) ×4 IMPLANT
CRADLE DONUT ADULT HEAD (MISCELLANEOUS) ×2 IMPLANT
DRAPE C-ARM 42X72 X-RAY (DRAPES) ×2 IMPLANT
DRAPE POUCH INSTRU U-SHP 10X18 (DRAPES) ×2 IMPLANT
DRAPE SURG 17X23 STRL (DRAPES) ×2 IMPLANT
DRAPE U-SHAPE 47X51 STRL (DRAPES) ×2 IMPLANT
DRSG MEPILEX BORDER 4X4 (GAUZE/BANDAGES/DRESSINGS) ×2 IMPLANT
DURAPREP 26ML APPLICATOR (WOUND CARE) ×2 IMPLANT
ELECT COATED BLADE 2.86 ST (ELECTRODE) ×2 IMPLANT
ELECT REM PT RETURN 9FT ADLT (ELECTROSURGICAL) ×2
ELECTRODE REM PT RTRN 9FT ADLT (ELECTROSURGICAL) ×1 IMPLANT
GLOVE BIOGEL PI IND STRL 8.5 (GLOVE) ×1 IMPLANT
GLOVE BIOGEL PI INDICATOR 8.5 (GLOVE) ×1
GLOVE ECLIPSE 8.5 STRL (GLOVE) ×2 IMPLANT
GOWN PREVENTION PLUS XXLARGE (GOWN DISPOSABLE) ×2 IMPLANT
GOWN STRL REIN XL XLG (GOWN DISPOSABLE) ×4 IMPLANT
INTERLOCK LRDTC CRVCL VBR 8MM (Peek) ×1 IMPLANT
KIT BASIN OR (CUSTOM PROCEDURE TRAY) ×2 IMPLANT
KIT ROOM TURNOVER OR (KITS) ×2 IMPLANT
LORDOTIC CERVICAL VBR 8MM SM (Peek) ×2 IMPLANT
NEEDLE SPNL 18GX3.5 QUINCKE PK (NEEDLE) ×2 IMPLANT
NS IRRIG 1000ML POUR BTL (IV SOLUTION) ×2 IMPLANT
PACK ORTHO CERVICAL (CUSTOM PROCEDURE TRAY) ×2 IMPLANT
PACK UNIVERSAL I (CUSTOM PROCEDURE TRAY) ×2 IMPLANT
PAD ARMBOARD 7.5X6 YLW CONV (MISCELLANEOUS) ×4 IMPLANT
PATTIES SURGICAL .25X.25 (GAUZE/BANDAGES/DRESSINGS) IMPLANT
PLATE SKYLINE 12MM (Plate) ×4 IMPLANT
PUTTY BONE DBX 5CC MIX (Putty) ×2 IMPLANT
RESTRAINT LIMB HOLDER UNIV (RESTRAINTS) ×2 IMPLANT
SCREW SELF DRILL SKYLINE 12MM (Screw) ×2 IMPLANT
SCREW SKYLINE 14MM SD-VA (Screw) ×4 IMPLANT
SPONGE INTESTINAL PEANUT (DISPOSABLE) ×2 IMPLANT
SPONGE SURGIFOAM ABS GEL 100 (HEMOSTASIS) ×2 IMPLANT
SURGIFLO TRUKIT (HEMOSTASIS) IMPLANT
SUT MNCRL AB 3-0 PS2 18 (SUTURE) ×2 IMPLANT
SUT SILK 2 0 (SUTURE)
SUT SILK 2-0 18XBRD TIE 12 (SUTURE) IMPLANT
SUT VIC AB 2-0 CT1 18 (SUTURE) ×2 IMPLANT
SYR BULB IRRIGATION 50ML (SYRINGE) ×2 IMPLANT
SYR CONTROL 10ML LL (SYRINGE) ×2 IMPLANT
TAPE CLOTH 4X10 WHT NS (GAUZE/BANDAGES/DRESSINGS) ×2 IMPLANT
TAPE UMBILICAL COTTON 1/8X30 (MISCELLANEOUS) ×2 IMPLANT
TOWEL OR 17X24 6PK STRL BLUE (TOWEL DISPOSABLE) ×2 IMPLANT
TOWEL OR 17X26 10 PK STRL BLUE (TOWEL DISPOSABLE) ×2 IMPLANT
WATER STERILE IRR 1000ML POUR (IV SOLUTION) ×2 IMPLANT

## 2013-01-26 NOTE — Transfer of Care (Signed)
Immediate Anesthesia Transfer of Care Note  Patient: Kristopher Valdez  Procedure(s) Performed: Procedure(s): ANTERIOR CERVICAL DECOMPRESSION/DISCECTOMY FUSION 1 LEVEL C4-5 (N/A)  Patient Location: PACU  Anesthesia Type:General  Level of Consciousness: awake, alert  and oriented  Airway & Oxygen Therapy: Patient Spontanous Breathing and Patient connected to face mask oxygen  Post-op Assessment: Report given to PACU RN, Post -op Vital signs reviewed and stable and Patient moving all extremities X 4  Post vital signs: Reviewed and stable  Complications: No apparent anesthesia complications

## 2013-01-26 NOTE — H&P (Signed)
No change in clinical exam H+P reviewed  

## 2013-01-26 NOTE — Anesthesia Postprocedure Evaluation (Signed)
  Anesthesia Post-op Note  Patient: Kristopher Valdez  Procedure(s) Performed: Procedure(s): ANTERIOR CERVICAL DECOMPRESSION/DISCECTOMY FUSION 1 LEVEL C4-5 (N/A)  Patient Location: PACU  Anesthesia Type:General  Level of Consciousness: awake, alert  and oriented  Airway and Oxygen Therapy: Patient Spontanous Breathing and Patient connected to nasal cannula oxygen  Post-op Pain: moderate  Post-op Assessment: Post-op Vital signs reviewed, Patient's Cardiovascular Status Stable, Respiratory Function Stable, Patent Airway, No signs of Nausea or vomiting, Adequate PO intake and Pain level controlled  Post-op Vital Signs: Reviewed and stable  Complications: No apparent anesthesia complications

## 2013-01-26 NOTE — Brief Op Note (Signed)
01/26/2013  1:37 PM  PATIENT:  Kristopher Valdez  37 y.o. male  PRE-OPERATIVE DIAGNOSIS:  CERVICAL HARD DISC HERNIATION  POST-OPERATIVE DIAGNOSIS:  CERVICAL HARD DISC HERNIATION  PROCEDURE:  Procedure(s): ANTERIOR CERVICAL DECOMPRESSION/DISCECTOMY FUSION 1 LEVEL C4-5 (N/A)  SURGEON:  Surgeon(s) and Role:    * Venita Lick, MD - Primary  PHYSICIAN ASSISTANT:   ASSISTANTS: none   ANESTHESIA:   general  EBL:  Total I/O In: 1000 [I.V.:1000] Out: 50 [Blood:50]  BLOOD ADMINISTERED:none  DRAINS: none   LOCAL MEDICATIONS USED:  MARCAINE     SPECIMEN:  No Specimen  DISPOSITION OF SPECIMEN:  N/A  COUNTS:  YES  TOURNIQUET:  * No tourniquets in log *  DICTATION: .Other Dictation: Dictation Number [id number]  PLAN OF CARE: Admit to inpatient   PATIENT DISPOSITION:  PACU - hemodynamically stable.

## 2013-01-26 NOTE — Anesthesia Procedure Notes (Signed)
Procedure Name: Intubation Date/Time: 01/26/2013 11:49 AM Performed by: Quentin Ore Pre-anesthesia Checklist: Patient identified, Emergency Drugs available, Suction available, Patient being monitored and Timeout performed Patient Re-evaluated:Patient Re-evaluated prior to inductionOxygen Delivery Method: Circle system utilized Preoxygenation: Pre-oxygenation with 100% oxygen Intubation Type: IV induction Ventilation: Mask ventilation without difficulty Laryngoscope Size: Mac and 3 Grade View: Grade III Tube type: Oral Tube size: 7.5 mm Number of attempts: 1 Airway Equipment and Method: Stylet and LTA kit utilized Placement Confirmation: ETT inserted through vocal cords under direct vision,  positive ETCO2 and breath sounds checked- equal and bilateral Secured at: 23 cm Tube secured with: Tape Dental Injury: Teeth and Oropharynx as per pre-operative assessment

## 2013-01-26 NOTE — Plan of Care (Signed)
Problem: Consults Goal: Diagnosis - Spinal Surgery ACDF C4-5

## 2013-01-26 NOTE — Preoperative (Signed)
Beta Blockers   Reason not to administer Beta Blockers:Not Applicable 

## 2013-01-26 NOTE — Anesthesia Preprocedure Evaluation (Addendum)
Anesthesia Evaluation  Patient identified by MRN, date of birth, ID band Patient awake    Reviewed: Allergy & Precautions, H&P , NPO status , Patient's Chart, lab work & pertinent test results, reviewed documented beta blocker date and time   Airway Mallampati: II TM Distance: >3 FB Neck ROM: full    Dental  (+) Teeth Intact   Pulmonary shortness of breath and with exertion,  breath sounds clear to auscultation        Cardiovascular negative cardio ROS  Rhythm:regular     Neuro/Psych  Headaches, Seizures -, Well Controlled,   Neuromuscular disease CVA, Residual Symptoms negative psych ROS   GI/Hepatic Neg liver ROS, GERD-  Medicated and Controlled,  Endo/Other  negative endocrine ROS  Renal/GU negative Renal ROS  negative genitourinary   Musculoskeletal   Abdominal   Peds  Hematology negative hematology ROS (+)   Anesthesia Other Findings See surgeon's H&P   Reproductive/Obstetrics negative OB ROS                          Anesthesia Physical Anesthesia Plan  ASA: III  Anesthesia Plan: General   Post-op Pain Management:    Induction: Intravenous  Airway Management Planned: Oral ETT  Additional Equipment:   Intra-op Plan:   Post-operative Plan: Extubation in OR  Informed Consent: I have reviewed the patients History and Physical, chart, labs and discussed the procedure including the risks, benefits and alternatives for the proposed anesthesia with the patient or authorized representative who has indicated his/her understanding and acceptance.   Dental Advisory Given  Plan Discussed with: CRNA and Surgeon  Anesthesia Plan Comments:         Anesthesia Quick Evaluation

## 2013-01-27 NOTE — Progress Notes (Signed)
    Subjective: Procedure(s) (LRB): ANTERIOR CERVICAL DECOMPRESSION/DISCECTOMY FUSION 1 LEVEL C4-5 (N/A) 1 Day Post-Op  Patient reports pain as 2 on 0-10 scale.  Reports decreased arm pain reports incisional neck pain   Positive void Negative bowel movement Positive flatus Negative chest pain or shortness of breath  Objective: Vital signs in last 24 hours: Temp:  [97.6 F (36.4 C)-98.6 F (37 C)] 97.9 F (36.6 C) (05/01 0619) Pulse Rate:  [72-98] 95 (05/01 0619) Resp:  [10-18] 18 (05/01 0619) BP: (128-145)/(64-96) 145/64 mmHg (05/01 0619) SpO2:  [92 %-100 %] 99 % (05/01 0619)  Intake/Output from previous day: 04/30 0701 - 05/01 0700 In: 1750 [I.V.:1750] Out: 50 [Blood:50]  Labs: No results found for this basename: WBC, RBC, HCT, PLT,  in the last 72 hours  Recent Labs  01/26/13 0936  NA 138  K 4.4  CL 104  CO2 27  BUN 11  CREATININE 0.77  GLUCOSE 88  CALCIUM 9.3   No results found for this basename: LABPT, INR,  in the last 72 hours  Physical Exam: Neurologically intact ABD soft Neurovascular intact Intact pulses distally Incision: dressing C/D/I and no drainage Compartment soft  Assessment/Plan: Patient stable  xrays n/a Mobilization with physical therapy Encourage incentive spirometry Continue care  Advance diet Up with therapy D/C IV fluids Plan on d/c to home  Venita Lick, MD [facility] [phone]

## 2013-01-27 NOTE — Progress Notes (Signed)
Utilization review completed.  

## 2013-01-27 NOTE — Care Management Note (Signed)
CARE MANAGEMENT NOTE 01/27/2013  Patient:  Kristopher Valdez,Kristopher Valdez   Account Number:  [id number]  Date Initiated:  01/27/2013  Documentation initiated by:  Seanna Sisler  Subjective/Objective Assessment:   36 yr old male s/p ACDF C4-5     Action/Plan:   No home needs identified.   Anticipated DC Date:  01/27/2013   Anticipated DC Plan:  HOME/SELF CARE      DC Planning Services  CM consult      PAC Choice  NA   Choice offered to / List presented to:             Status of service:  Completed, signed off Medicare Important Message given?   (If response is "NO", the following Medicare IM given date fields will be blank) Date Medicare IM given:   Date Additional Medicare IM given:    Discharge Disposition:  HOME/SELF CARE  Per UR Regulation:    If discussed at Long Length of Stay Meetings, dates discussed:    Comments:    

## 2013-01-27 NOTE — Discharge Summary (Signed)
Patient ID: Kristopher Valdez MRN: [id number] DOB/AGE: 1976/09/13 37 y.o.  Admit date: 01-30-2013 Discharge date: 01/27/2013  Admission Diagnoses:  Principal Problem:   Cervical spondylosis without myelopathy   Discharge Diagnoses:  Principal Problem:   Cervical spondylosis without myelopathy  status post Procedure(s): ANTERIOR CERVICAL DECOMPRESSION/DISCECTOMY FUSION 1 LEVEL C4-5  Past Medical History  Diagnosis Date  . Shortness of breath     with exertion  . GERD (gastroesophageal reflux disease)     takes tums prn  . Hemorrhoid   . Narcolepsy   . Cerebral palsy   . Neuromuscular disorder     Cerebral Palsy  . Kidney stones     "passed on own" (Jan 30, 2013)  . Pneumonia late 1990's?  . Concussion ~ 2007    "related to seizures; happened twice; don't know if LOC" (01/30/13)  . Migraines     "monthly" (Jan 30, 2013)  . Epilepsy     "all but the 2 in ~ 2007 happen when I'm asleep" (Jan 30, 2013)  . Stroke ~ 2005    mild stroke, has short term memory loss  . Arthritis     'all over" (2013-01-30)  . Chronic back pain greater than 3 months duration     Surgeries: Procedure(s): ANTERIOR CERVICAL DECOMPRESSION/DISCECTOMY FUSION 1 LEVEL C4-5 on 01-30-13   Consultants:  none  Discharged Condition: Improved  Hospital Course: KEISHAUN Valdez is an 37 y.o. male who was admitted Jan 30, 2013 for operative treatment of Cervical spondylosis without myelopathy. Patient failed conservative treatments (please see the history and physical for the specifics) and had severe unremitting pain that affects sleep, daily activities and work/hobbies. After pre-op clearance, the patient was taken to the operating room on 01/30/13 and underwent  Procedure(s): ANTERIOR CERVICAL DECOMPRESSION/DISCECTOMY FUSION 1 LEVEL C4-5.    Patient was given perioperative antibiotics: Anti-infectives   Start     Dose/Rate Route Frequency Ordered Stop   01-30-2013 2000  ceFAZolin (ANCEF) IVPB 1 g/50 mL premix     1 g 100 mL/hr over 30 Minutes Intravenous Every 8 hours 01/30/2013 1619 01/27/13 0519   01/25/13 1420  ceFAZolin (ANCEF) IVPB 2 g/50 mL premix     2 g 100 mL/hr over 30 Minutes Intravenous 30 min pre-op 01/25/13 1420 01/30/2013 1202       Patient was given sequential compression devices and early ambulation to prevent DVT.   Patient benefited maximally from hospital stay and there were no complications. At the time of discharge, the patient was urinating/moving their bowels without difficulty, tolerating a regular diet, pain is controlled with oral pain medications and they have been cleared by PT/OT.   Recent vital signs: Patient Vitals for the past 24 hrs:  BP Temp Pulse Resp SpO2  01/27/13 0619 145/64 mmHg 97.9 F (36.6 C) 95 18 99 %  01/30/13 2213 140/68 mmHg 98.6 F (37 C) 98 18 96 %  01/30/2013 1604 133/96 mmHg 97.6 F (36.4 C) - 16 95 %  01/30/2013 1545 - 97.8 F (36.6 C) 85 16 95 %  01/30/13 1515 - - 86 11 95 %  2013-01-30 1500 128/90 mmHg 97.8 F (36.6 C) 86 15 92 %  Jan 30, 2013 1445 131/85 mmHg - 76 11 97 %  01/30/13 1430 139/85 mmHg - 73 12 98 %  01-30-2013 1415 141/87 mmHg - 72 16 100 %  2013-01-30 1401 - 97.6 F (36.4 C) 78 10 99 %  Jan 30, 2013 1400 137/85 mmHg - 78 11 99 %     Recent laboratory studies:  Recent Labs  01/26/13 0936  NA 138  K 4.4  CL 104  CO2 27  BUN 11  CREATININE 0.77  GLUCOSE 88  CALCIUM 9.3     Discharge Medications:     Medication List    STOP taking these medications       HYDROcodone-acetaminophen 10-325 MG per tablet  Commonly known as:  NORCO      TAKE these medications       divalproex 500 MG DR tablet  Commonly known as:  DEPAKOTE  Take 500 mg by mouth 3 (three) times daily.     docusate sodium 100 MG capsule  Commonly known as:  COLACE  Take 1 capsule (100 mg total) by mouth 3 (three) times daily as needed for constipation.     methocarbamol 500 MG tablet  Commonly known as:  ROBAXIN  Take 1 tablet (500 mg total) by mouth 3  (three) times daily as needed.     ondansetron 4 MG tablet  Commonly known as:  ZOFRAN  Take 1 tablet (4 mg total) by mouth every 8 (eight) hours as needed for nausea.     oxyCODONE-acetaminophen 10-325 MG per tablet  Commonly known as:  PERCOCET  Take 1 tablet by mouth every 4 (four) hours as needed for pain.     polyethylene glycol powder powder  Commonly known as:  GLYCOLAX  Take 17 g by mouth daily.        Diagnostic Studies: Dg Chest 2 View  01/26/2013  *RADIOLOGY REPORT*  Clinical Data: Shortness of breath, preop.  CHEST - 2 VIEW  Comparison: 09/18/2009  Findings: Low lung volumes, improved since prior study. Heart and mediastinal contours are within normal limits.  No focal opacities or effusions.  No acute bony abnormality.  IMPRESSION: No active cardiopulmonary disease.   Original Report Authenticated By: Charlett Nose, M.D.    Dg Cervical Spine 2-3 Views  01/26/2013  *RADIOLOGY REPORT*  Clinical Data:C4-5 fusion.  DG C-ARM 1-60 MIN,CERVICAL SPINE - 2-3 VIEW  Fluoroscopy Time: 16 seconds.  Comparison: None.  Findings: Two intraoperative C-arm views submitted for review at surgery.  This reveals C4-5 fusion with interbody spacer, anterior plate and screw is in place without complication noted.  IMPRESSION: C4-5 fusion.   Original Report Authenticated By: Lacy Duverney, M.D.    Dg Cervical Spine 2 Or 3 Views  01/21/2013  *RADIOLOGY REPORT*  Clinical Data: Neck pain  CERVICAL SPINE - 2-3 VIEW  Comparison: None.  Findings: There is anatomic alignment of the vertebral bodies.  No vertebral compression deformity.  Mild narrowing of the C4-5 disc. Unremarkable prevertebral soft tissues.  Levels have been marked.  IMPRESSION: Mild degenerative change.  No acute bony pathology.  Levels have been marked.   Original Report Authenticated By: Jolaine Click, M.D.    Dg C-arm 1-60 Min  01/26/2013  *RADIOLOGY REPORT*  Clinical Data:C4-5 fusion.  DG C-ARM 1-60 MIN,CERVICAL SPINE - 2-3 VIEW  Fluoroscopy  Time: 16 seconds.  Comparison: None.  Findings: Two intraoperative C-arm views submitted for review at surgery.  This reveals C4-5 fusion with interbody spacer, anterior plate and screw is in place without complication noted.  IMPRESSION: C4-5 fusion.   Original Report Authenticated By: Lacy Duverney, M.D.           Follow-up Information   Follow up with Alvy Beal, MD. Call in 2 weeks.   Contact information:   [address] [place] [address] [phone]  Discharge Plan:  discharge to home  Disposition: stable    Signed: Mekai Wilkinson D for Dr. Venita Lick [facility] [phone] 01/27/2013, 12:10 PM

## 2013-01-27 NOTE — Care Management Note (Signed)
CARE MANAGEMENT NOTE 01/27/2013  Patient:  NAMEER, SUMMER   Account Number:  [id number]  Date Initiated:  01/27/2013  Documentation initiated by:  Vance Peper  Subjective/Objective Assessment:   37 yr old male s/p ACDF C4-5     Action/Plan:   No home needs identified.   Anticipated DC Date:  01/27/2013   Anticipated DC Plan:  HOME/SELF CARE      DC Planning Services  CM consult      PAC Choice  NA   Choice offered to / List presented to:             Status of service:  Completed, signed off Medicare Important Message given?   (If response is "NO", the following Medicare IM given date fields will be blank) Date Medicare IM given:   Date Additional Medicare IM given:    Discharge Disposition:  HOME/SELF CARE  Per UR Regulation:    If discussed at Long Length of Stay Meetings, dates discussed:    Comments:

## 2013-01-27 NOTE — Op Note (Signed)
Kristopher Valdez, THEIL NO.:  [id number]  MEDICAL RECORD NO.:  [id number]  LOCATION:  [id number]                        FACILITY:  [facility]  PHYSICIAN:  Alvy Beal, MD    DATE OF BIRTH:  04-09-1976  DATE OF PROCEDURE:  01/26/2013 DATE OF DISCHARGE:                              OPERATIVE REPORT   PREOPERATIVE DIAGNOSIS:  Cervical spondylotic radiculopathy.  POSTOPERATIVE DIAGNOSIS:  Cervical spondylotic radiculopathy.  PROCEDURE:  Anterior cervical diskectomy and fusion C4-5.  COMPLICATIONS:  None.  CONDITION:  Stable.  HISTORY:  This is a very pleasant gentleman who has been having long- standing significant axillary neck pain and radiation into the C5 dermatome on the left side.  As a result, the patient had failed conservative management, and his pain and quality of life progressed. As a result of this, we elected to proceed with surgery.  All appropriate risks, benefits, and alternatives were discussed with the patient and consent was obtained.  OPERATIVE NOTE:  The patient was brought to the operating room, placed supine on the operating table.  After successful induction of general anesthesia and endotracheal intubation, TEDs, SCDs were applied.  Roll towels were placed between the shoulder blades, and the wrist strings were applied and the anterior cervical spine was prepped and draped in standard fashion.  Once this was completed, a time-out was done to confirm patient, procedure, and all other pertinent important data. Once this was completed, a transverse incision was made starting at the midline and proceeding over to the left.  This was centered over the C4- 5 disk space, just below the thyroid cartilage.  Sharp dissection was carried out down to the deep fascia.  The platysma was identified and sharply incised.  I then identified the medial border of the sternocleidomastoid and sharply dissected along this border between the strap muscles and the  sternocleidomastoid.  Once this was done, I then bluntly dissected through the remaining deep cervical and prevertebral fascia until I was down to the anterior cervical spine.  I mobilized the esophagus and trachea, and placed a needle into the 4-5 disk space. Intraoperative x-ray confirmed that I was at the 4-5 level.  Once this was completed, I then marked the disk and then mobilized the longus colli muscles from the midbody of 4 to the midbody of 5.  I then placed self-retaining retractors underneath the longus colli muscle, deflated the endotracheal cuff, expanded the retractors, and reinflated.  I then placed distraction pins into the bodies of 4 and 5, and distracted the 4- 5 disk space.  An annulotomy was performed with a 15 blade scalpel and then I used a combination of pituitary rongeurs, curettes, and Kerrison rongeurs to remove all of the disk material.  Once this was completed, I then released the anulus from the posterior aspect of the vertebral bodies. There was a soft disk, bone spur osteophyte combination in the left-hand corner which I was able to resect using a 1-mm Kerrison.  Once I had released this, I had adequate decompression.  I then rasped the endplates to ensure I had bleeding subchondral bone.  I then trialed 7 and 8 mm interbody spacer.  I  elected to use the 8 small lordotic spacer.  This was packed with DBX mix and then malleted to the appropriate depth.  I then removed all the retracting devices and then placed DePuy cervical plate fixing it with 14-mm screws into the bodies of 4 and 12-mm screws into the body of 5.  All screws were tightened down.  I had excellent purchase in the bone and then I used the locking device per manufacture's standard to lock all the screws.  I then irrigated the wound copiously with normal saline.  Made sure hemostasis using bipolar electrocautery, and returned the trachea and esophagus to the midline position.  I checked to  ensure there was no entrapment of the soft tissue structures underneath the plate.  I then closed the platysma with interrupted 2-0 Vicryl sutures, and 3-0 Monocryl for the skin.  Steri-Strips, dry dressing, and a collar were applied.  The patient was extubated, transferred to PACU without incident.     Alvy Beal, MD     DDB/MEDQ  D:  01/26/2013  T:  01/27/2013  Job:  [id number]

## 2013-01-27 NOTE — Evaluation (Signed)
Occupational Therapy Evaluation Patient Details Name: Kristopher Valdez MRN: [id number] DOB: 1976-01-08 Today's Date: 01/27/2013 Time: 1020-1100 OT Time Calculation (min): 40 min  OT Assessment / Plan / Recommendation Clinical Impression    Pt is pleasant and highly motivated 37 y.o. male s/p anterior diskectomy and fusion C4-C5. Pt at overall supervision level for ADLs with vc's to adhere to precautions (Min A only to start buttons on shirt). Will have 24/7 assistance at home with 32 y.o. daughter. No acute OT needed at this time.      OT Assessment  Patient does not need any further OT services    Follow Up Recommendations  No OT follow up;Supervision - Intermittent    Barriers to Discharge      Equipment Recommendations  None recommended by OT    Recommendations for Other Services    Frequency       Precautions / Restrictions Precautions Precautions: Cervical Precaution Comments: pt has CP; decreased use of R UE; hisory of amb with decreased heel strike and knee flexion on  L LE  Restrictions Weight Bearing Restrictions: No   Pertinent Vitals/Pain 5-6/10. Notified nurse for pain meds.    ADL  Eating/Feeding: Independent Where Assessed - Eating/Feeding: Chair Grooming: Modified independent Where Assessed - Grooming: Unsupported standing Upper Body Bathing: Supervision/safety Where Assessed - Upper Body Bathing: Unsupported sitting Lower Body Bathing: Supervision/safety Where Assessed - Lower Body Bathing: Supported sit to stand Upper Body Dressing: Minimal assistance Where Assessed - Upper Body Dressing: Unsupported sitting Lower Body Dressing: Supervision/safety Where Assessed - Lower Body Dressing: Supported sit to stand Toilet Transfer: Supervision/safety Statistician Method: Sit to Barista: Regular height toilet Toileting - Architect and Hygiene: Supervision/safety Where Assessed - Engineer, mining and Hygiene:  Sit to stand from 3-in-1 or toilet Tub/Shower Transfer: Performed;Modified independent Tub/Shower Transfer Method: Science writer: Other (comment) (stepped over tub) Equipment Used: Gait belt;Other (comment) (cervical collar) ADL Comments: Pt at supervision level for most ADLs (for cues to maintain cervical precautions) with exception of UB dressing requiring Min A to start one button on shirt due to pt cervical precautions and CP affecting RUE. Pt practiced tub and regular toilet transfer. Also donned cervical collar.     OT Diagnosis:    OT Problem List:   OT Treatment Interventions:     OT Goals    Visit Information  Last OT Received On: 01/27/13 Assistance Needed: +1    Subjective Data      Prior Functioning     Home Living Lives With: Spouse;Family (17 and 29 y.o.) Available Help at Discharge: Family;Available 24 hours/day (37 y.o. will be home all the time) Type of Home: Mobile home Home Access: Stairs to enter Entrance Stairs-Number of Steps: 5 Entrance Stairs-Rails: Left Home Layout: One level Bathroom Shower/Tub: Forensic scientist: Standard Bathroom Accessibility: Yes How Accessible: Accessible via walker Home Adaptive Equipment: None Prior Function Level of Independence: Independent Able to Take Stairs?: Yes Driving: Yes Vocation: On disability Communication Communication: Other (comment) (CP affecting speech (slow to produce speech at times)) Dominant Hand: Left         Vision/Perception Vision - History Baseline Vision: Wears glasses all the time Patient Visual Report: No change from baseline   Cognition  Cognition Arousal/Alertness: Awake/alert Behavior During Therapy: WFL for tasks assessed/performed Overall Cognitive Status: Within Functional Limits for tasks assessed    Extremity/Trunk Assessment Right Upper Extremity Assessment RUE ROM/Strength/Tone: Deficits RUE ROM/Strength/Tone  Deficits: fluctuating tone  in RUE due to CP Left Upper Extremity Assessment LUE ROM/Strength/Tone: [facility] for tasks assessed     Mobility Bed Mobility Bed Mobility: Rolling Left;Left Sidelying to Sit;Sitting - Scoot to [employer] of Bed;Sit to Sidelying Left;Rolling Right Rolling Right: 6: Modified independent (Device/Increase time) Rolling Left: 5: Supervision Left Sidelying to Sit: 6: Modified independent (Device/Increase time) Sitting - Scoot to Edge of Bed: 6: Modified independent (Device/Increase time) Sit to Sidelying Left: 6: Modified independent (Device/Increase time) Details for Bed Mobility Assistance: supervision only for initial cuing when rolling. Transfers Transfers: Sit to Stand;Stand to Sit Sit to Stand: 5: Supervision;With upper extremity assist;From chair/3-in-1;From toilet;Without upper extremity assist;6: Modified independent (Device/Increase time) Stand to Sit: With upper extremity assist;To chair/3-in-1;Without upper extremity assist;6: Modified independent (Device/Increase time);5: Supervision Details for Transfer Assistance: supervision initially due to vc's to maintain cervical precautions. Pt then at Mod I level     Exercise     Balance Balance Balance Assessed: No   End of Session OT - End of Session Equipment Utilized During Treatment: Gait belt;Cervical collar Activity Tolerance: Patient tolerated treatment well Patient left: in bed;with call bell/phone within reach;with family/visitor present Nurse Communication: Mobility status;Patient requests pain meds  GO     Earlie Raveling OTR/L [phone] 01/27/2013, 12:13 PM

## 2013-01-27 NOTE — Evaluation (Signed)
Physical Therapy Evaluation Patient Details Name: Kristopher Valdez MRN: [id number] DOB: 10-25-1975 Today's Date: 01/27/2013 Time: 4098-1191 PT Time Calculation (min): 18 min  PT Assessment / Plan / Recommendation Clinical Impression  Pt is pleasant and highly motivated 37 y.o. male s/p anterior diskectomy and fusion C4-C5. Pt amb at baseline. Will have 24/7 assistance at home with 74 y.o. daughter. No acute PT needed at this time.      PT Assessment  Patent does not need any further PT services    Follow Up Recommendations  No PT follow up    Does the patient have the potential to tolerate intense rehabilitation      Barriers to Discharge        Equipment Recommendations  None recommended by PT    Recommendations for Other Services     Frequency      Precautions / Restrictions Precautions Precautions: Cervical Precaution Comments: pt has CP; decreased use of R UE; hisory of amb with decreased heel strike and knee flexion on  L LE  Restrictions Weight Bearing Restrictions: No   Pertinent Vitals/Pain 5-6/10 pt premedicated.       Mobility  Bed Mobility Bed Mobility: Sit to Sidelying Left;Rolling Right Rolling Right: 6: Modified independent (Device/Increase time) Rolling Left: 5: Supervision Left Sidelying to Sit: 6: Modified independent (Device/Increase time) Sitting - Scoot to Edge of Bed: 6: Modified independent (Device/Increase time) Sit to Sidelying Left: 6: Modified independent (Device/Increase time) Details for Bed Mobility Assistance: pt demo good log rolling technique; able to perform bed mobility with increased time; no rails needed  Transfers Transfers: Sit to Stand;Stand to Sit Sit to Stand: 6: Modified independent (Device/Increase time);From bed Stand to Sit: 6: Modified independent (Device/Increase time) Details for Transfer Assistance: pt able to adhere to cervical neck precautions required increased time due to pain  Ambulation/Gait Ambulation/Gait  Assistance: 6: Modified independent (Device/Increase time) Ambulation Distance (Feet): 150 Feet Assistive device: None Ambulation/Gait Assistance Details: demo good technique; step-through gt; demo safe turns and ability to adhere to cervical neck precautions  Gait Pattern: Step-through pattern Gait velocity: decreased Stairs: Yes Stairs Assistance: 6: Modified independent (Device/Increase time) Stair Management Technique: Two rails;Alternating pattern Number of Stairs: 5 Wheelchair Mobility Wheelchair Mobility: No    Exercises     PT Diagnosis:    PT Problem List:   PT Treatment Interventions:     PT Goals Acute Rehab PT Goals PT Goal Formulation: With patient  Visit Information  Last PT Received On: 01/27/13 Assistance Needed: +1    Subjective Data  Subjective: Pt standing with OT and wife present; agreeable to therapy. "Ill go home as soon as yall say i can" Patient Stated Goal: home today   Prior Functioning  Home Living Lives With: Spouse;Family (17 and 47 y.o.) Available Help at Discharge: Family;Available 24 hours/day (37 y.o. will be home all the time) Type of Home: Mobile home Home Access: Stairs to enter Entrance Stairs-Number of Steps: 5 Entrance Stairs-Rails: Left Home Layout: One level Bathroom Shower/Tub: Forensic scientist: Standard Bathroom Accessibility: Yes How Accessible: Accessible via walker Home Adaptive Equipment: None Prior Function Level of Independence: Independent Able to Take Stairs?: Yes Driving: Yes Vocation: On disability Communication Communication: Other (comment) (CP affecting speech (slow to produce speech at times)) Dominant Hand: Left    Cognition  Cognition Arousal/Alertness: Awake/alert Behavior During Therapy: WFL for tasks assessed/performed Overall Cognitive Status: Within Functional Limits for tasks assessed    Extremity/Trunk Assessment Right Upper Extremity Assessment RUE  ROM/Strength/Tone:  Deficits RUE ROM/Strength/Tone Deficits: fluctuating tone in RUE due to CP Left Upper Extremity Assessment LUE ROM/Strength/Tone: [facility] for tasks assessed   Balance Balance Balance Assessed: No  End of Session PT - End of Session Equipment Utilized During Treatment: Gait belt;Cervical collar Activity Tolerance: Patient tolerated treatment well Patient left: in bed;with call bell/phone within reach;with family/visitor present Nurse Communication: Mobility status;Other (comment) (No DME needs for D/C)  GP     Kristopher Valdez, [place] [phone] 01/27/2013, 12:07 PM

## 2013-01-31 ENCOUNTER — Encounter (HOSPITAL_COMMUNITY): Payer: Self-pay | Admitting: Orthopedic Surgery

## 2013-02-03 DIAGNOSIS — M542 Cervicalgia: Secondary | ICD-10-CM | POA: Diagnosis not present

## 2013-02-03 DIAGNOSIS — M5030 Other cervical disc degeneration, unspecified cervical region: Secondary | ICD-10-CM | POA: Diagnosis not present

## 2013-03-25 DIAGNOSIS — Z981 Arthrodesis status: Secondary | ICD-10-CM | POA: Diagnosis not present

## 2013-04-04 DIAGNOSIS — Z981 Arthrodesis status: Secondary | ICD-10-CM | POA: Diagnosis not present

## 2013-04-14 DIAGNOSIS — N23 Unspecified renal colic: Secondary | ICD-10-CM | POA: Diagnosis not present

## 2013-04-14 DIAGNOSIS — R109 Unspecified abdominal pain: Secondary | ICD-10-CM | POA: Diagnosis not present

## 2013-05-11 DIAGNOSIS — Z981 Arthrodesis status: Secondary | ICD-10-CM | POA: Diagnosis not present

## 2013-05-24 DIAGNOSIS — Z981 Arthrodesis status: Secondary | ICD-10-CM | POA: Diagnosis not present

## 2013-06-07 DIAGNOSIS — Z981 Arthrodesis status: Secondary | ICD-10-CM | POA: Diagnosis not present

## 2013-06-21 DIAGNOSIS — M25519 Pain in unspecified shoulder: Secondary | ICD-10-CM | POA: Diagnosis not present

## 2013-06-29 DIAGNOSIS — S8990XA Unspecified injury of unspecified lower leg, initial encounter: Secondary | ICD-10-CM | POA: Diagnosis not present

## 2013-06-29 DIAGNOSIS — R296 Repeated falls: Secondary | ICD-10-CM | POA: Diagnosis not present

## 2013-06-29 DIAGNOSIS — IMO0002 Reserved for concepts with insufficient information to code with codable children: Secondary | ICD-10-CM | POA: Diagnosis not present

## 2013-06-29 DIAGNOSIS — M79609 Pain in unspecified limb: Secondary | ICD-10-CM | POA: Diagnosis not present

## 2013-06-30 DIAGNOSIS — M79609 Pain in unspecified limb: Secondary | ICD-10-CM | POA: Diagnosis not present

## 2013-07-12 DIAGNOSIS — M79609 Pain in unspecified limb: Secondary | ICD-10-CM | POA: Diagnosis not present

## 2013-07-25 DIAGNOSIS — M79609 Pain in unspecified limb: Secondary | ICD-10-CM | POA: Diagnosis not present

## 2013-08-23 DIAGNOSIS — M545 Low back pain: Secondary | ICD-10-CM | POA: Diagnosis not present

## 2013-09-06 DIAGNOSIS — M545 Low back pain: Secondary | ICD-10-CM | POA: Diagnosis not present

## 2013-11-01 DIAGNOSIS — M545 Low back pain, unspecified: Secondary | ICD-10-CM | POA: Diagnosis not present

## 2014-01-23 DIAGNOSIS — R079 Chest pain, unspecified: Secondary | ICD-10-CM | POA: Diagnosis not present

## 2014-01-23 DIAGNOSIS — R091 Pleurisy: Secondary | ICD-10-CM | POA: Diagnosis not present

## 2014-02-06 DIAGNOSIS — M5137 Other intervertebral disc degeneration, lumbosacral region: Secondary | ICD-10-CM | POA: Diagnosis not present

## 2014-02-06 DIAGNOSIS — G894 Chronic pain syndrome: Secondary | ICD-10-CM | POA: Diagnosis not present

## 2014-02-06 DIAGNOSIS — M5030 Other cervical disc degeneration, unspecified cervical region: Secondary | ICD-10-CM | POA: Diagnosis not present

## 2014-02-06 DIAGNOSIS — Z79899 Other long term (current) drug therapy: Secondary | ICD-10-CM | POA: Diagnosis not present

## 2014-03-07 DIAGNOSIS — M545 Low back pain, unspecified: Secondary | ICD-10-CM | POA: Diagnosis not present

## 2014-03-07 DIAGNOSIS — G894 Chronic pain syndrome: Secondary | ICD-10-CM | POA: Diagnosis not present

## 2014-03-08 DIAGNOSIS — M542 Cervicalgia: Secondary | ICD-10-CM | POA: Diagnosis not present

## 2014-03-08 DIAGNOSIS — IMO0002 Reserved for concepts with insufficient information to code with codable children: Secondary | ICD-10-CM | POA: Diagnosis not present

## 2014-03-08 DIAGNOSIS — Z6834 Body mass index (BMI) 34.0-34.9, adult: Secondary | ICD-10-CM | POA: Diagnosis not present

## 2014-03-08 DIAGNOSIS — R0609 Other forms of dyspnea: Secondary | ICD-10-CM | POA: Diagnosis not present

## 2014-03-08 DIAGNOSIS — R0602 Shortness of breath: Secondary | ICD-10-CM | POA: Diagnosis not present

## 2014-05-15 DIAGNOSIS — M5030 Other cervical disc degeneration, unspecified cervical region: Secondary | ICD-10-CM | POA: Diagnosis not present

## 2014-05-15 DIAGNOSIS — M5137 Other intervertebral disc degeneration, lumbosacral region: Secondary | ICD-10-CM | POA: Diagnosis not present

## 2014-05-15 DIAGNOSIS — G894 Chronic pain syndrome: Secondary | ICD-10-CM | POA: Diagnosis not present

## 2014-05-15 DIAGNOSIS — Z79899 Other long term (current) drug therapy: Secondary | ICD-10-CM | POA: Diagnosis not present

## 2014-06-19 DIAGNOSIS — M5137 Other intervertebral disc degeneration, lumbosacral region: Secondary | ICD-10-CM | POA: Diagnosis not present

## 2014-06-19 DIAGNOSIS — Z981 Arthrodesis status: Secondary | ICD-10-CM | POA: Diagnosis not present

## 2014-06-19 DIAGNOSIS — M25519 Pain in unspecified shoulder: Secondary | ICD-10-CM | POA: Diagnosis not present

## 2014-06-21 IMAGING — CR DG CERVICAL SPINE 2 OR 3 VIEWS
2 series · 2 of 2 positions shown · non-contrast
Comparison: None.

CLINICAL DATA: Neck pain

CERVICAL SPINE - 2-3 VIEW

[view not recorded (1 of 2)]
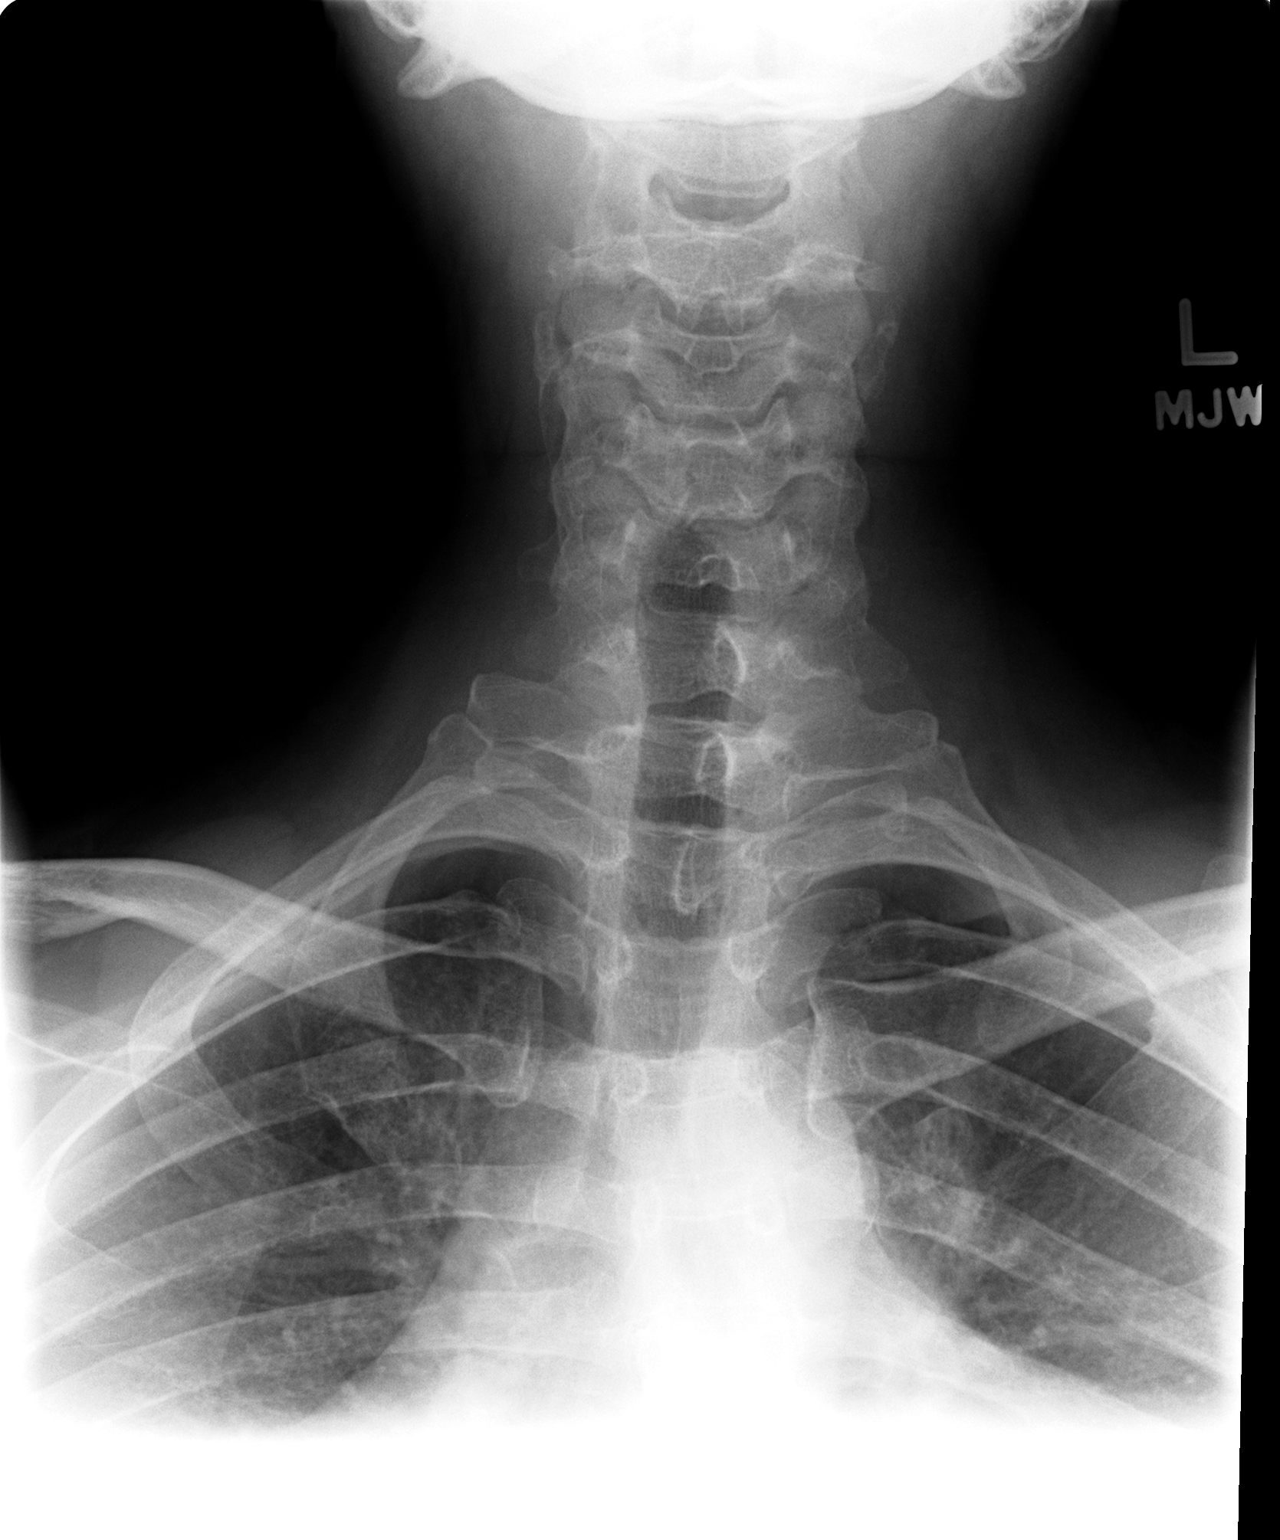

[view not recorded (2 of 2)]
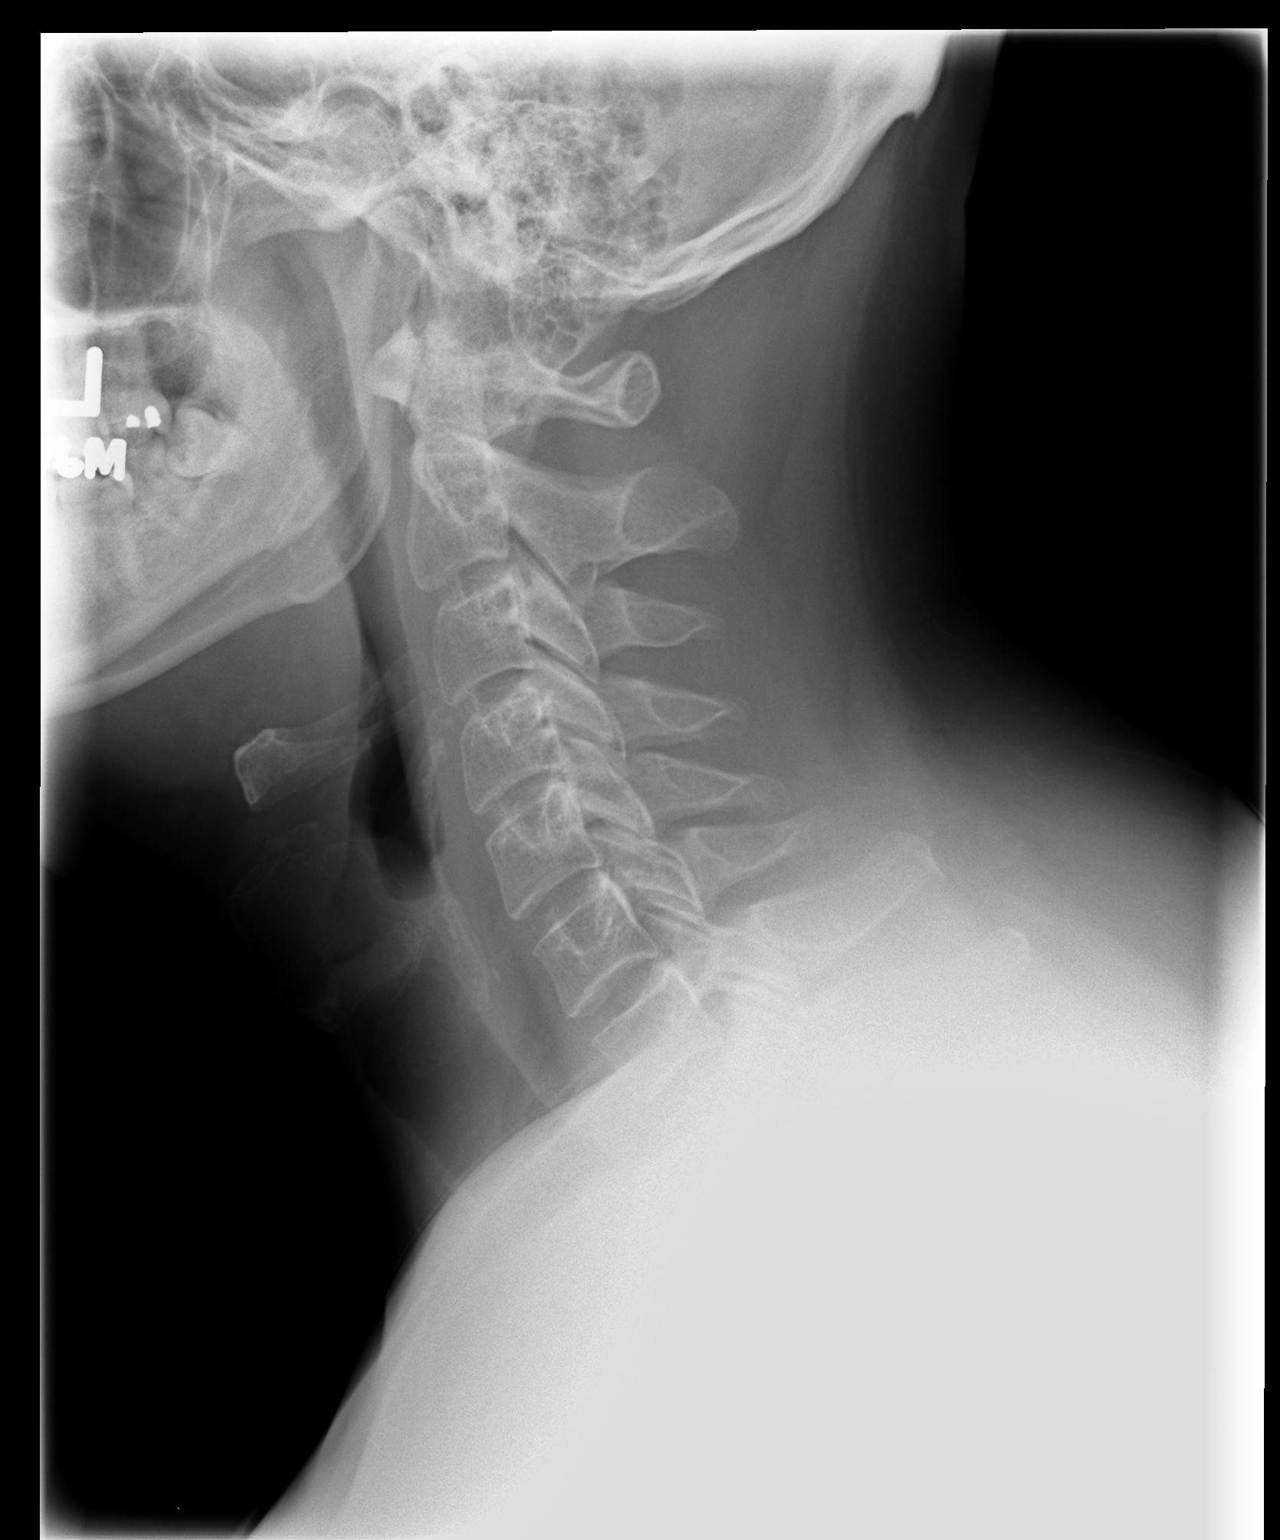

[2 of 2 positions shown; findings below may reference images not displayed]

FINDINGS: There is anatomic alignment of the vertebral bodies.  No
vertebral compression deformity.  Mild narrowing of the C4-5 disc.
Unremarkable prevertebral soft tissues.  Levels have been marked.
IMPRESSION: Mild degenerative change.  No acute bony pathology.  Levels have
been marked.

## 2014-06-26 IMAGING — RF DG C-ARM 61-120 MIN
1 series · 2 of 2 positions shown · non-contrast
Comparison: None.

CLINICAL DATA: C4-5 fusion.

DG C-ARM 1-60 MIN,CERVICAL SPINE - 2-3 VIEW
Fluoroscopy Time: 16 seconds.

[Series 1: run · 2 of 2 slices shown]
[im 1/2]
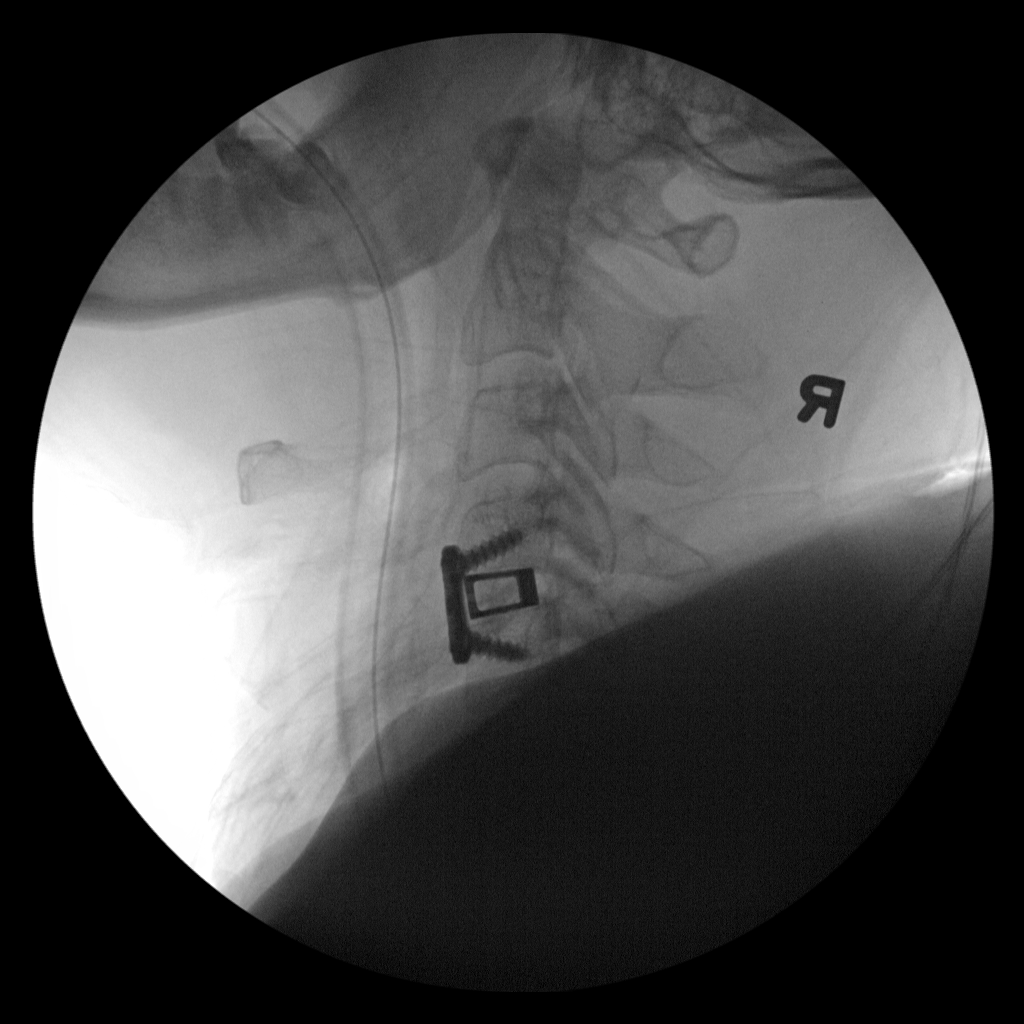
[im 2/2]
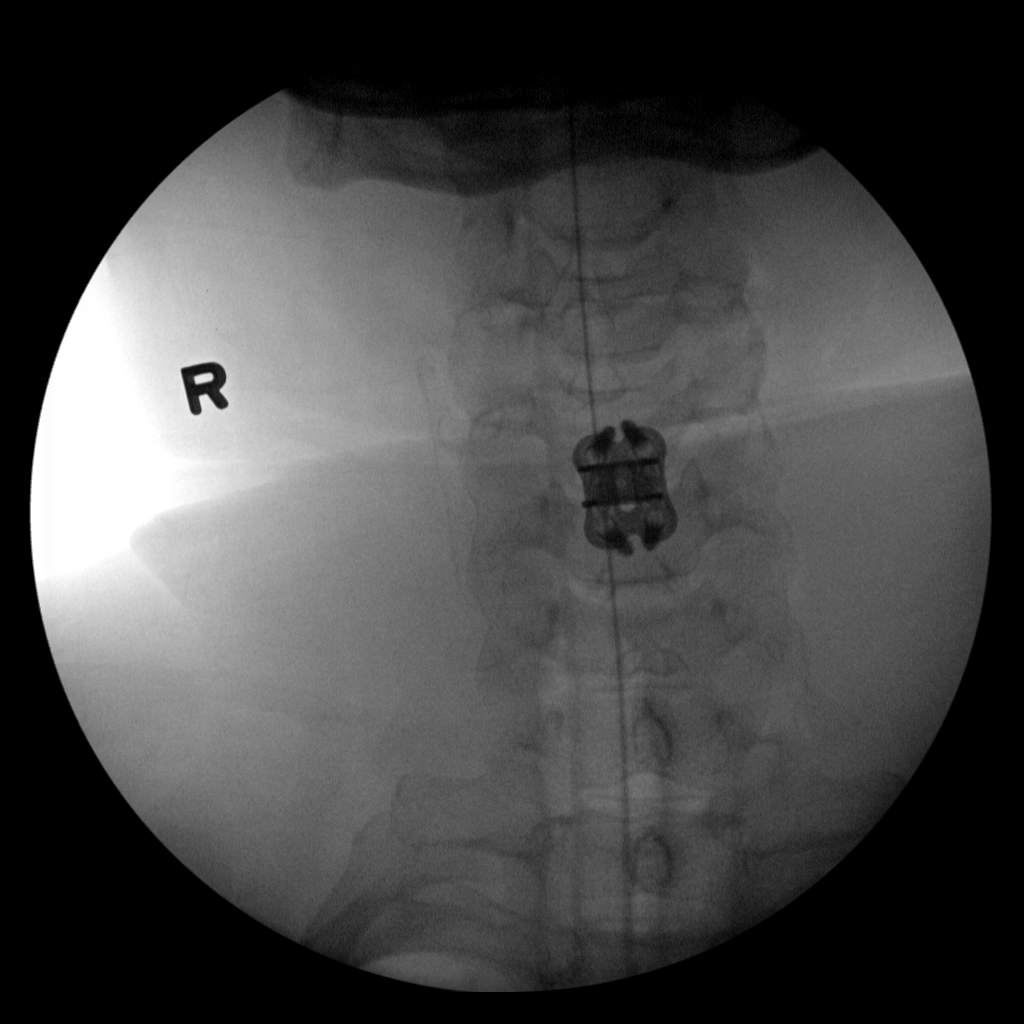

[2 of 2 positions shown; findings below may reference images not displayed]

FINDINGS: Two intraoperative C-arm views submitted for review at
surgery.  This reveals C4-5 fusion with interbody spacer, anterior
plate and screw is in place without complication noted.
IMPRESSION: C4-5 fusion.

## 2014-06-26 IMAGING — CR DG CHEST 2V
2 series · 2 of 2 positions shown · non-contrast
Comparison: 09/18/2009

CLINICAL DATA: Shortness of breath, preop.

CHEST - 2 VIEW

[view not recorded (1 of 2)]
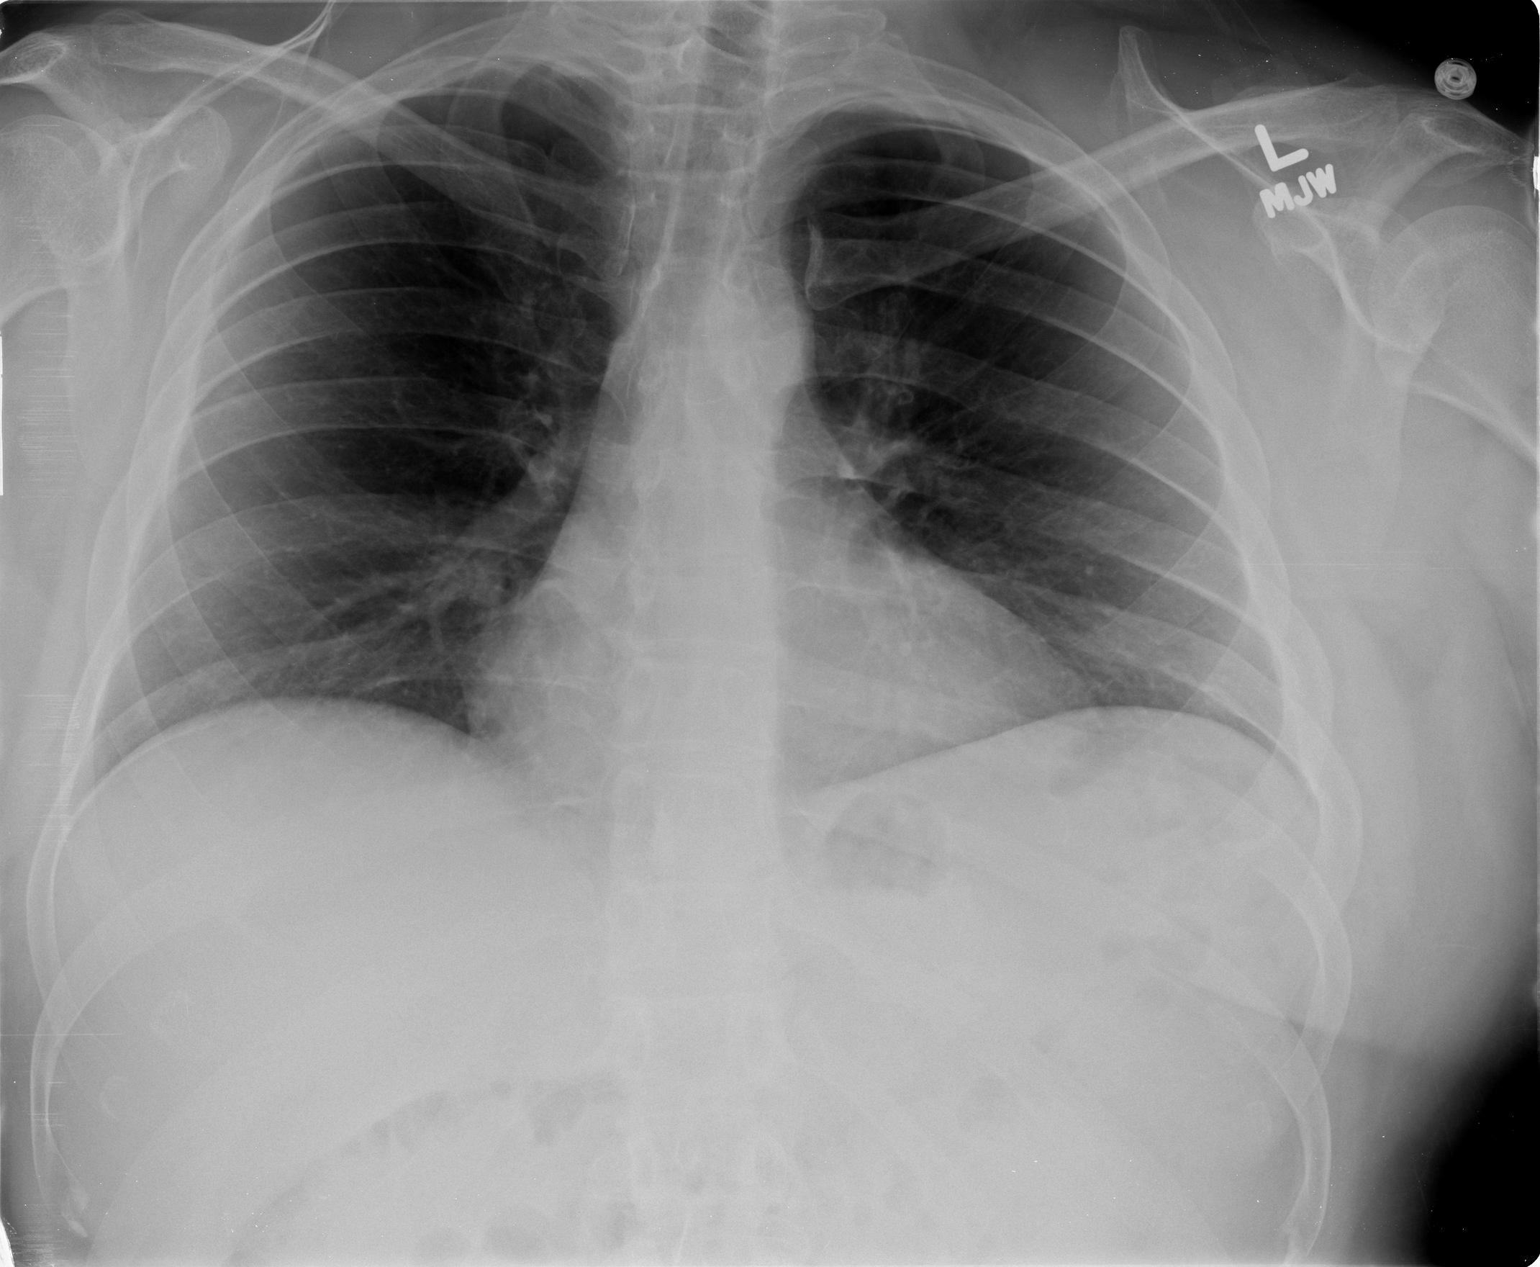

[view not recorded (2 of 2)]
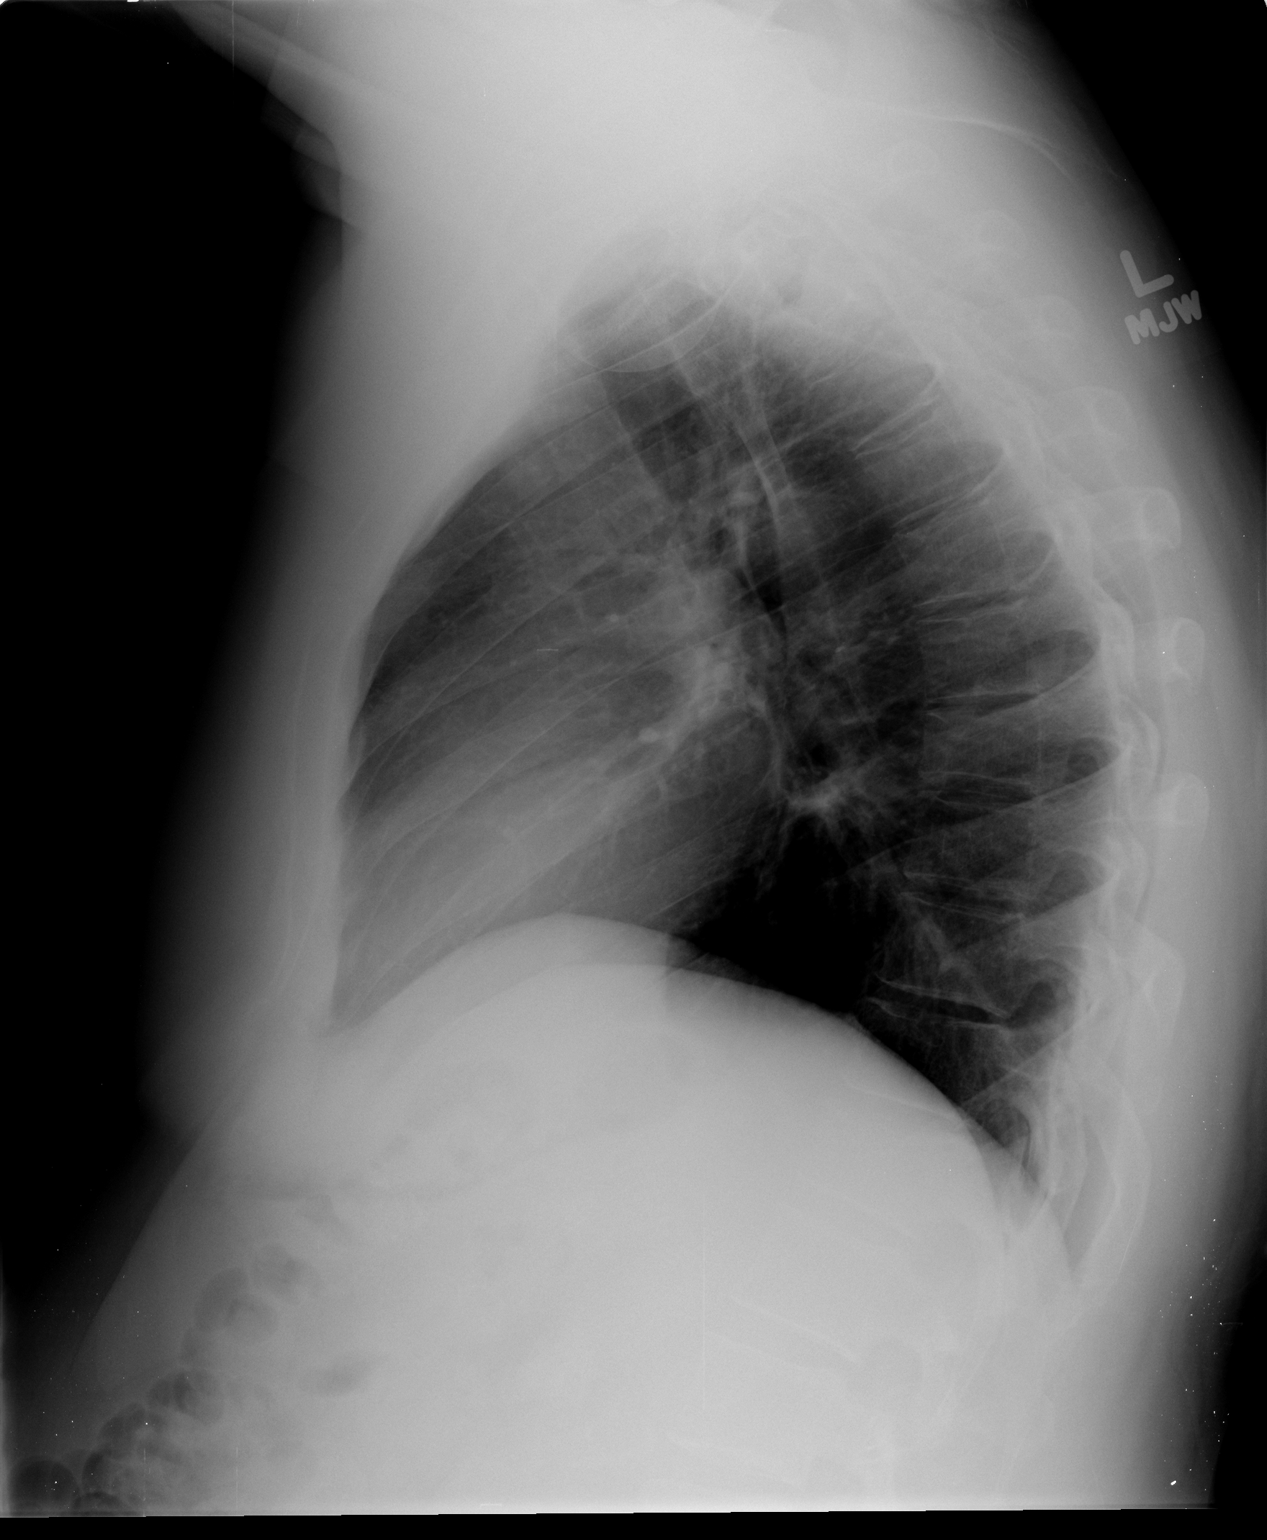

[2 of 2 positions shown; findings below may reference images not displayed]

FINDINGS: Low lung volumes, improved since prior study. Heart and
mediastinal contours are within normal limits.  No focal opacities
or effusions.  No acute bony abnormality.
IMPRESSION: No active cardiopulmonary disease.

## 2014-06-28 DIAGNOSIS — M25519 Pain in unspecified shoulder: Secondary | ICD-10-CM | POA: Diagnosis not present

## 2014-06-30 DIAGNOSIS — M545 Low back pain: Secondary | ICD-10-CM | POA: Diagnosis not present

## 2014-06-30 DIAGNOSIS — M25512 Pain in left shoulder: Secondary | ICD-10-CM | POA: Diagnosis not present

## 2014-07-13 DIAGNOSIS — M25512 Pain in left shoulder: Secondary | ICD-10-CM | POA: Diagnosis not present

## 2014-07-20 DIAGNOSIS — M25512 Pain in left shoulder: Secondary | ICD-10-CM | POA: Diagnosis not present

## 2014-08-11 DIAGNOSIS — Z6835 Body mass index (BMI) 35.0-35.9, adult: Secondary | ICD-10-CM | POA: Diagnosis not present

## 2014-08-11 DIAGNOSIS — F419 Anxiety disorder, unspecified: Secondary | ICD-10-CM | POA: Diagnosis not present

## 2014-08-11 DIAGNOSIS — R1032 Left lower quadrant pain: Secondary | ICD-10-CM | POA: Diagnosis not present

## 2014-08-29 DIAGNOSIS — M5136 Other intervertebral disc degeneration, lumbar region: Secondary | ICD-10-CM | POA: Diagnosis not present

## 2014-08-29 DIAGNOSIS — M546 Pain in thoracic spine: Secondary | ICD-10-CM | POA: Diagnosis not present

## 2014-08-29 DIAGNOSIS — M5032 Other cervical disc degeneration, mid-cervical region: Secondary | ICD-10-CM | POA: Diagnosis not present

## 2014-11-21 DIAGNOSIS — M5136 Other intervertebral disc degeneration, lumbar region: Secondary | ICD-10-CM | POA: Diagnosis not present

## 2014-11-21 DIAGNOSIS — Z981 Arthrodesis status: Secondary | ICD-10-CM | POA: Diagnosis not present

## 2014-11-21 DIAGNOSIS — G894 Chronic pain syndrome: Secondary | ICD-10-CM | POA: Diagnosis not present

## 2014-11-28 DIAGNOSIS — Z981 Arthrodesis status: Secondary | ICD-10-CM | POA: Diagnosis not present

## 2014-12-08 DIAGNOSIS — M5032 Other cervical disc degeneration, mid-cervical region: Secondary | ICD-10-CM | POA: Diagnosis not present

## 2014-12-08 DIAGNOSIS — M5136 Other intervertebral disc degeneration, lumbar region: Secondary | ICD-10-CM | POA: Diagnosis not present

## 2014-12-08 DIAGNOSIS — G894 Chronic pain syndrome: Secondary | ICD-10-CM | POA: Diagnosis not present

## 2014-12-08 DIAGNOSIS — Z79891 Long term (current) use of opiate analgesic: Secondary | ICD-10-CM | POA: Diagnosis not present

## 2014-12-19 DIAGNOSIS — G894 Chronic pain syndrome: Secondary | ICD-10-CM | POA: Diagnosis not present

## 2014-12-19 DIAGNOSIS — M5032 Other cervical disc degeneration, mid-cervical region: Secondary | ICD-10-CM | POA: Diagnosis not present

## 2015-01-30 DIAGNOSIS — M5030 Other cervical disc degeneration, unspecified cervical region: Secondary | ICD-10-CM | POA: Diagnosis not present

## 2015-01-30 DIAGNOSIS — Z981 Arthrodesis status: Secondary | ICD-10-CM | POA: Diagnosis not present

## 2015-03-07 DIAGNOSIS — S4992XA Unspecified injury of left shoulder and upper arm, initial encounter: Secondary | ICD-10-CM | POA: Diagnosis not present

## 2015-03-07 DIAGNOSIS — M25512 Pain in left shoulder: Secondary | ICD-10-CM | POA: Diagnosis not present

## 2015-03-07 DIAGNOSIS — M5412 Radiculopathy, cervical region: Secondary | ICD-10-CM | POA: Diagnosis not present

## 2015-03-07 DIAGNOSIS — R51 Headache: Secondary | ICD-10-CM | POA: Diagnosis not present

## 2015-03-07 DIAGNOSIS — M545 Low back pain: Secondary | ICD-10-CM | POA: Diagnosis not present

## 2015-03-07 DIAGNOSIS — S199XXA Unspecified injury of neck, initial encounter: Secondary | ICD-10-CM | POA: Diagnosis not present

## 2015-03-07 DIAGNOSIS — S39012A Strain of muscle, fascia and tendon of lower back, initial encounter: Secondary | ICD-10-CM | POA: Diagnosis not present

## 2015-03-07 DIAGNOSIS — S0990XA Unspecified injury of head, initial encounter: Secondary | ICD-10-CM | POA: Diagnosis not present

## 2015-03-12 DIAGNOSIS — M546 Pain in thoracic spine: Secondary | ICD-10-CM | POA: Diagnosis not present

## 2015-03-12 DIAGNOSIS — M5136 Other intervertebral disc degeneration, lumbar region: Secondary | ICD-10-CM | POA: Diagnosis not present

## 2015-03-12 DIAGNOSIS — G894 Chronic pain syndrome: Secondary | ICD-10-CM | POA: Diagnosis not present

## 2015-03-12 DIAGNOSIS — M5032 Other cervical disc degeneration, mid-cervical region: Secondary | ICD-10-CM | POA: Diagnosis not present

## 2015-03-21 DIAGNOSIS — M5032 Other cervical disc degeneration, mid-cervical region: Secondary | ICD-10-CM | POA: Diagnosis not present

## 2015-03-22 DIAGNOSIS — R399 Unspecified symptoms and signs involving the genitourinary system: Secondary | ICD-10-CM | POA: Diagnosis not present

## 2015-03-22 DIAGNOSIS — M5032 Other cervical disc degeneration, mid-cervical region: Secondary | ICD-10-CM | POA: Diagnosis not present

## 2015-03-22 DIAGNOSIS — R030 Elevated blood-pressure reading, without diagnosis of hypertension: Secondary | ICD-10-CM | POA: Diagnosis not present

## 2015-03-22 DIAGNOSIS — Z6834 Body mass index (BMI) 34.0-34.9, adult: Secondary | ICD-10-CM | POA: Diagnosis not present

## 2015-03-22 DIAGNOSIS — M5020 Other cervical disc displacement, unspecified cervical region: Secondary | ICD-10-CM | POA: Diagnosis not present

## 2015-03-26 DIAGNOSIS — M5136 Other intervertebral disc degeneration, lumbar region: Secondary | ICD-10-CM | POA: Diagnosis not present

## 2015-03-26 DIAGNOSIS — M546 Pain in thoracic spine: Secondary | ICD-10-CM | POA: Diagnosis not present

## 2015-03-26 DIAGNOSIS — M5032 Other cervical disc degeneration, mid-cervical region: Secondary | ICD-10-CM | POA: Diagnosis not present

## 2015-03-26 DIAGNOSIS — S134XXD Sprain of ligaments of cervical spine, subsequent encounter: Secondary | ICD-10-CM | POA: Diagnosis not present

## 2015-04-23 DIAGNOSIS — M546 Pain in thoracic spine: Secondary | ICD-10-CM | POA: Diagnosis not present

## 2015-04-23 DIAGNOSIS — M5032 Other cervical disc degeneration, mid-cervical region: Secondary | ICD-10-CM | POA: Diagnosis not present

## 2015-04-23 DIAGNOSIS — M5136 Other intervertebral disc degeneration, lumbar region: Secondary | ICD-10-CM | POA: Diagnosis not present

## 2015-04-23 DIAGNOSIS — S134XXS Sprain of ligaments of cervical spine, sequela: Secondary | ICD-10-CM | POA: Diagnosis not present

## 2015-05-02 DIAGNOSIS — M5032 Other cervical disc degeneration, mid-cervical region: Secondary | ICD-10-CM | POA: Diagnosis not present

## 2015-05-18 DIAGNOSIS — G43809 Other migraine, not intractable, without status migrainosus: Secondary | ICD-10-CM | POA: Diagnosis not present

## 2015-05-18 DIAGNOSIS — G809 Cerebral palsy, unspecified: Secondary | ICD-10-CM | POA: Diagnosis not present

## 2015-05-18 DIAGNOSIS — R51 Headache: Secondary | ICD-10-CM | POA: Diagnosis not present

## 2015-05-18 DIAGNOSIS — R42 Dizziness and giddiness: Secondary | ICD-10-CM | POA: Diagnosis not present

## 2015-05-18 DIAGNOSIS — H53149 Visual discomfort, unspecified: Secondary | ICD-10-CM | POA: Diagnosis not present

## 2015-05-18 DIAGNOSIS — I10 Essential (primary) hypertension: Secondary | ICD-10-CM | POA: Diagnosis not present

## 2015-05-18 DIAGNOSIS — Z8673 Personal history of transient ischemic attack (TIA), and cerebral infarction without residual deficits: Secondary | ICD-10-CM | POA: Diagnosis not present

## 2015-05-21 DIAGNOSIS — G894 Chronic pain syndrome: Secondary | ICD-10-CM | POA: Diagnosis not present

## 2015-05-21 DIAGNOSIS — Z79891 Long term (current) use of opiate analgesic: Secondary | ICD-10-CM | POA: Diagnosis not present

## 2015-05-21 DIAGNOSIS — M546 Pain in thoracic spine: Secondary | ICD-10-CM | POA: Diagnosis not present

## 2015-05-21 DIAGNOSIS — M5136 Other intervertebral disc degeneration, lumbar region: Secondary | ICD-10-CM | POA: Diagnosis not present

## 2015-06-24 DIAGNOSIS — R0602 Shortness of breath: Secondary | ICD-10-CM | POA: Diagnosis not present

## 2015-06-24 DIAGNOSIS — Z8249 Family history of ischemic heart disease and other diseases of the circulatory system: Secondary | ICD-10-CM | POA: Diagnosis not present

## 2015-06-24 DIAGNOSIS — R079 Chest pain, unspecified: Secondary | ICD-10-CM | POA: Diagnosis not present

## 2015-06-24 DIAGNOSIS — G809 Cerebral palsy, unspecified: Secondary | ICD-10-CM | POA: Diagnosis not present

## 2015-06-24 DIAGNOSIS — R110 Nausea: Secondary | ICD-10-CM | POA: Diagnosis not present

## 2015-06-24 DIAGNOSIS — R61 Generalized hyperhidrosis: Secondary | ICD-10-CM | POA: Diagnosis not present

## 2015-06-25 DIAGNOSIS — R079 Chest pain, unspecified: Secondary | ICD-10-CM | POA: Diagnosis not present

## 2015-06-25 DIAGNOSIS — M549 Dorsalgia, unspecified: Secondary | ICD-10-CM | POA: Diagnosis not present

## 2015-06-25 DIAGNOSIS — Z8673 Personal history of transient ischemic attack (TIA), and cerebral infarction without residual deficits: Secondary | ICD-10-CM | POA: Diagnosis not present

## 2015-06-25 DIAGNOSIS — M25512 Pain in left shoulder: Secondary | ICD-10-CM | POA: Diagnosis not present

## 2015-06-25 DIAGNOSIS — G809 Cerebral palsy, unspecified: Secondary | ICD-10-CM | POA: Diagnosis not present

## 2015-06-25 DIAGNOSIS — R0989 Other specified symptoms and signs involving the circulatory and respiratory systems: Secondary | ICD-10-CM | POA: Diagnosis not present

## 2015-06-25 DIAGNOSIS — Z79899 Other long term (current) drug therapy: Secondary | ICD-10-CM | POA: Diagnosis not present

## 2015-06-25 DIAGNOSIS — M25511 Pain in right shoulder: Secondary | ICD-10-CM | POA: Diagnosis not present

## 2015-06-25 DIAGNOSIS — N62 Hypertrophy of breast: Secondary | ICD-10-CM | POA: Diagnosis not present

## 2015-06-25 DIAGNOSIS — J180 Bronchopneumonia, unspecified organism: Secondary | ICD-10-CM | POA: Diagnosis not present

## 2015-06-25 DIAGNOSIS — M79622 Pain in left upper arm: Secondary | ICD-10-CM | POA: Diagnosis not present

## 2015-06-25 DIAGNOSIS — R61 Generalized hyperhidrosis: Secondary | ICD-10-CM | POA: Diagnosis not present

## 2015-06-25 DIAGNOSIS — Z23 Encounter for immunization: Secondary | ICD-10-CM | POA: Diagnosis not present

## 2015-06-25 DIAGNOSIS — I10 Essential (primary) hypertension: Secondary | ICD-10-CM | POA: Diagnosis not present

## 2015-06-25 DIAGNOSIS — R42 Dizziness and giddiness: Secondary | ICD-10-CM | POA: Diagnosis not present

## 2015-06-25 DIAGNOSIS — G40909 Epilepsy, unspecified, not intractable, without status epilepticus: Secondary | ICD-10-CM | POA: Diagnosis not present

## 2015-06-26 DIAGNOSIS — Z8249 Family history of ischemic heart disease and other diseases of the circulatory system: Secondary | ICD-10-CM

## 2015-06-26 DIAGNOSIS — R0789 Other chest pain: Secondary | ICD-10-CM | POA: Diagnosis not present

## 2015-06-26 DIAGNOSIS — M549 Dorsalgia, unspecified: Secondary | ICD-10-CM | POA: Diagnosis not present

## 2015-06-26 DIAGNOSIS — Z8781 Personal history of (healed) traumatic fracture: Secondary | ICD-10-CM | POA: Insufficient documentation

## 2015-06-26 DIAGNOSIS — R079 Chest pain, unspecified: Secondary | ICD-10-CM | POA: Diagnosis not present

## 2015-06-26 DIAGNOSIS — G8929 Other chronic pain: Secondary | ICD-10-CM | POA: Insufficient documentation

## 2015-06-26 HISTORY — DX: Personal history of (healed) traumatic fracture: Z87.81

## 2015-06-26 HISTORY — DX: Family history of ischemic heart disease and other diseases of the circulatory system: Z82.49

## 2015-07-04 DIAGNOSIS — M25512 Pain in left shoulder: Secondary | ICD-10-CM | POA: Diagnosis not present

## 2015-07-04 DIAGNOSIS — Z1389 Encounter for screening for other disorder: Secondary | ICD-10-CM | POA: Diagnosis not present

## 2015-07-04 DIAGNOSIS — R0789 Other chest pain: Secondary | ICD-10-CM | POA: Diagnosis not present

## 2015-07-04 DIAGNOSIS — Z6831 Body mass index (BMI) 31.0-31.9, adult: Secondary | ICD-10-CM | POA: Diagnosis not present

## 2015-07-06 DIAGNOSIS — M546 Pain in thoracic spine: Secondary | ICD-10-CM | POA: Diagnosis not present

## 2015-07-06 DIAGNOSIS — M5032 Other cervical disc degeneration, mid-cervical region, unspecified level: Secondary | ICD-10-CM | POA: Diagnosis not present

## 2015-07-06 DIAGNOSIS — G894 Chronic pain syndrome: Secondary | ICD-10-CM | POA: Diagnosis not present

## 2015-07-06 DIAGNOSIS — S134XXS Sprain of ligaments of cervical spine, sequela: Secondary | ICD-10-CM | POA: Diagnosis not present

## 2015-08-06 DIAGNOSIS — M546 Pain in thoracic spine: Secondary | ICD-10-CM | POA: Diagnosis not present

## 2015-08-06 DIAGNOSIS — S134XXS Sprain of ligaments of cervical spine, sequela: Secondary | ICD-10-CM | POA: Diagnosis not present

## 2015-08-06 DIAGNOSIS — G894 Chronic pain syndrome: Secondary | ICD-10-CM | POA: Diagnosis not present

## 2015-08-06 DIAGNOSIS — M50322 Other cervical disc degeneration at C5-C6 level: Secondary | ICD-10-CM | POA: Diagnosis not present

## 2015-09-04 DIAGNOSIS — S60222A Contusion of left hand, initial encounter: Secondary | ICD-10-CM | POA: Diagnosis not present

## 2015-11-09 DIAGNOSIS — E785 Hyperlipidemia, unspecified: Secondary | ICD-10-CM | POA: Diagnosis not present

## 2015-11-09 DIAGNOSIS — Z0000 Encounter for general adult medical examination without abnormal findings: Secondary | ICD-10-CM | POA: Diagnosis not present

## 2015-11-09 DIAGNOSIS — R1011 Right upper quadrant pain: Secondary | ICD-10-CM | POA: Diagnosis not present

## 2015-11-09 DIAGNOSIS — K219 Gastro-esophageal reflux disease without esophagitis: Secondary | ICD-10-CM | POA: Diagnosis not present

## 2015-11-09 DIAGNOSIS — G253 Myoclonus: Secondary | ICD-10-CM | POA: Diagnosis not present

## 2015-11-09 DIAGNOSIS — Z6832 Body mass index (BMI) 32.0-32.9, adult: Secondary | ICD-10-CM | POA: Diagnosis not present

## 2015-11-09 DIAGNOSIS — G40209 Localization-related (focal) (partial) symptomatic epilepsy and epileptic syndromes with complex partial seizures, not intractable, without status epilepticus: Secondary | ICD-10-CM | POA: Diagnosis not present

## 2015-11-09 DIAGNOSIS — F419 Anxiety disorder, unspecified: Secondary | ICD-10-CM | POA: Diagnosis not present

## 2015-11-10 DIAGNOSIS — R1011 Right upper quadrant pain: Secondary | ICD-10-CM | POA: Diagnosis not present

## 2015-11-10 DIAGNOSIS — E785 Hyperlipidemia, unspecified: Secondary | ICD-10-CM | POA: Diagnosis not present

## 2015-11-13 DIAGNOSIS — M50322 Other cervical disc degeneration at C5-C6 level: Secondary | ICD-10-CM | POA: Diagnosis not present

## 2015-11-19 DIAGNOSIS — K8020 Calculus of gallbladder without cholecystitis without obstruction: Secondary | ICD-10-CM | POA: Diagnosis not present

## 2015-11-22 DIAGNOSIS — R1013 Epigastric pain: Secondary | ICD-10-CM | POA: Diagnosis not present

## 2015-11-22 DIAGNOSIS — R1011 Right upper quadrant pain: Secondary | ICD-10-CM | POA: Diagnosis not present

## 2015-11-22 DIAGNOSIS — G809 Cerebral palsy, unspecified: Secondary | ICD-10-CM | POA: Diagnosis not present

## 2015-11-22 DIAGNOSIS — K8010 Calculus of gallbladder with chronic cholecystitis without obstruction: Secondary | ICD-10-CM | POA: Diagnosis not present

## 2015-11-28 DIAGNOSIS — G40909 Epilepsy, unspecified, not intractable, without status epilepticus: Secondary | ICD-10-CM | POA: Diagnosis not present

## 2015-11-28 DIAGNOSIS — K819 Cholecystitis, unspecified: Secondary | ICD-10-CM

## 2015-11-28 DIAGNOSIS — G809 Cerebral palsy, unspecified: Secondary | ICD-10-CM | POA: Diagnosis not present

## 2015-11-28 DIAGNOSIS — K769 Liver disease, unspecified: Secondary | ICD-10-CM | POA: Diagnosis not present

## 2015-11-28 DIAGNOSIS — E785 Hyperlipidemia, unspecified: Secondary | ICD-10-CM | POA: Diagnosis not present

## 2015-11-28 DIAGNOSIS — K8020 Calculus of gallbladder without cholecystitis without obstruction: Secondary | ICD-10-CM | POA: Diagnosis not present

## 2015-11-28 DIAGNOSIS — K219 Gastro-esophageal reflux disease without esophagitis: Secondary | ICD-10-CM | POA: Diagnosis not present

## 2015-11-28 DIAGNOSIS — N189 Chronic kidney disease, unspecified: Secondary | ICD-10-CM | POA: Diagnosis not present

## 2015-11-28 DIAGNOSIS — K8010 Calculus of gallbladder with chronic cholecystitis without obstruction: Secondary | ICD-10-CM | POA: Diagnosis not present

## 2015-11-28 DIAGNOSIS — K8000 Calculus of gallbladder with acute cholecystitis without obstruction: Secondary | ICD-10-CM | POA: Diagnosis not present

## 2015-11-28 DIAGNOSIS — I129 Hypertensive chronic kidney disease with stage 1 through stage 4 chronic kidney disease, or unspecified chronic kidney disease: Secondary | ICD-10-CM | POA: Diagnosis not present

## 2015-11-28 DIAGNOSIS — Z8673 Personal history of transient ischemic attack (TIA), and cerebral infarction without residual deficits: Secondary | ICD-10-CM | POA: Diagnosis not present

## 2015-11-28 DIAGNOSIS — Z79899 Other long term (current) drug therapy: Secondary | ICD-10-CM | POA: Diagnosis not present

## 2015-11-28 DIAGNOSIS — G4730 Sleep apnea, unspecified: Secondary | ICD-10-CM | POA: Diagnosis not present

## 2015-11-28 DIAGNOSIS — K824 Cholesterolosis of gallbladder: Secondary | ICD-10-CM | POA: Diagnosis not present

## 2015-11-28 HISTORY — PX: CHOLECYSTECTOMY: SHX55

## 2015-11-28 HISTORY — DX: Cholecystitis, unspecified: K81.9

## 2015-11-29 DIAGNOSIS — I129 Hypertensive chronic kidney disease with stage 1 through stage 4 chronic kidney disease, or unspecified chronic kidney disease: Secondary | ICD-10-CM | POA: Diagnosis not present

## 2015-11-29 DIAGNOSIS — K8010 Calculus of gallbladder with chronic cholecystitis without obstruction: Secondary | ICD-10-CM | POA: Diagnosis not present

## 2015-11-29 DIAGNOSIS — K824 Cholesterolosis of gallbladder: Secondary | ICD-10-CM | POA: Diagnosis not present

## 2015-11-29 DIAGNOSIS — K219 Gastro-esophageal reflux disease without esophagitis: Secondary | ICD-10-CM | POA: Diagnosis not present

## 2015-11-29 DIAGNOSIS — N189 Chronic kidney disease, unspecified: Secondary | ICD-10-CM | POA: Diagnosis not present

## 2015-11-29 DIAGNOSIS — K769 Liver disease, unspecified: Secondary | ICD-10-CM | POA: Diagnosis not present

## 2015-12-04 DIAGNOSIS — G4733 Obstructive sleep apnea (adult) (pediatric): Secondary | ICD-10-CM | POA: Diagnosis not present

## 2015-12-04 DIAGNOSIS — I63239 Cerebral infarction due to unspecified occlusion or stenosis of unspecified carotid arteries: Secondary | ICD-10-CM | POA: Diagnosis not present

## 2015-12-04 DIAGNOSIS — G40109 Localization-related (focal) (partial) symptomatic epilepsy and epileptic syndromes with simple partial seizures, not intractable, without status epilepticus: Secondary | ICD-10-CM | POA: Diagnosis not present

## 2015-12-04 DIAGNOSIS — R1310 Dysphagia, unspecified: Secondary | ICD-10-CM | POA: Diagnosis not present

## 2015-12-04 DIAGNOSIS — G4710 Hypersomnia, unspecified: Secondary | ICD-10-CM | POA: Diagnosis not present

## 2015-12-04 DIAGNOSIS — G2581 Restless legs syndrome: Secondary | ICD-10-CM | POA: Diagnosis not present

## 2015-12-04 DIAGNOSIS — G809 Cerebral palsy, unspecified: Secondary | ICD-10-CM | POA: Diagnosis not present

## 2015-12-04 DIAGNOSIS — G253 Myoclonus: Secondary | ICD-10-CM | POA: Diagnosis not present

## 2015-12-10 DIAGNOSIS — F5232 Male orgasmic disorder: Secondary | ICD-10-CM | POA: Diagnosis not present

## 2015-12-10 DIAGNOSIS — I861 Scrotal varices: Secondary | ICD-10-CM | POA: Diagnosis not present

## 2015-12-10 DIAGNOSIS — E669 Obesity, unspecified: Secondary | ICD-10-CM | POA: Diagnosis not present

## 2015-12-10 DIAGNOSIS — Z6832 Body mass index (BMI) 32.0-32.9, adult: Secondary | ICD-10-CM | POA: Diagnosis not present

## 2015-12-12 DIAGNOSIS — R569 Unspecified convulsions: Secondary | ICD-10-CM | POA: Diagnosis not present

## 2016-01-20 DIAGNOSIS — G478 Other sleep disorders: Secondary | ICD-10-CM | POA: Diagnosis not present

## 2016-01-29 DIAGNOSIS — G4710 Hypersomnia, unspecified: Secondary | ICD-10-CM | POA: Diagnosis not present

## 2016-01-29 DIAGNOSIS — G809 Cerebral palsy, unspecified: Secondary | ICD-10-CM | POA: Diagnosis not present

## 2016-01-29 DIAGNOSIS — G4700 Insomnia, unspecified: Secondary | ICD-10-CM | POA: Diagnosis not present

## 2016-01-29 DIAGNOSIS — G40109 Localization-related (focal) (partial) symptomatic epilepsy and epileptic syndromes with simple partial seizures, not intractable, without status epilepticus: Secondary | ICD-10-CM | POA: Diagnosis not present

## 2016-01-29 DIAGNOSIS — I63239 Cerebral infarction due to unspecified occlusion or stenosis of unspecified carotid arteries: Secondary | ICD-10-CM | POA: Diagnosis not present

## 2016-01-29 DIAGNOSIS — G4733 Obstructive sleep apnea (adult) (pediatric): Secondary | ICD-10-CM | POA: Diagnosis not present

## 2016-01-29 DIAGNOSIS — M791 Myalgia: Secondary | ICD-10-CM | POA: Diagnosis not present

## 2016-03-01 DIAGNOSIS — F5232 Male orgasmic disorder: Secondary | ICD-10-CM | POA: Diagnosis not present

## 2016-03-01 DIAGNOSIS — Z6831 Body mass index (BMI) 31.0-31.9, adult: Secondary | ICD-10-CM | POA: Diagnosis not present

## 2016-03-01 DIAGNOSIS — N341 Nonspecific urethritis: Secondary | ICD-10-CM | POA: Diagnosis not present

## 2016-03-01 DIAGNOSIS — B3749 Other urogenital candidiasis: Secondary | ICD-10-CM | POA: Diagnosis not present

## 2016-04-22 DIAGNOSIS — Z6832 Body mass index (BMI) 32.0-32.9, adult: Secondary | ICD-10-CM | POA: Diagnosis not present

## 2016-04-22 DIAGNOSIS — B079 Viral wart, unspecified: Secondary | ICD-10-CM | POA: Diagnosis not present

## 2016-04-22 DIAGNOSIS — G5692 Unspecified mononeuropathy of left upper limb: Secondary | ICD-10-CM | POA: Diagnosis not present

## 2016-04-28 DIAGNOSIS — M5136 Other intervertebral disc degeneration, lumbar region: Secondary | ICD-10-CM | POA: Diagnosis not present

## 2016-04-28 DIAGNOSIS — Z981 Arthrodesis status: Secondary | ICD-10-CM | POA: Diagnosis not present

## 2016-04-28 DIAGNOSIS — G894 Chronic pain syndrome: Secondary | ICD-10-CM | POA: Diagnosis not present

## 2016-04-28 DIAGNOSIS — M50322 Other cervical disc degeneration at C5-C6 level: Secondary | ICD-10-CM | POA: Diagnosis not present

## 2016-05-01 DIAGNOSIS — B079 Viral wart, unspecified: Secondary | ICD-10-CM | POA: Diagnosis not present

## 2016-05-19 DIAGNOSIS — R200 Anesthesia of skin: Secondary | ICD-10-CM | POA: Diagnosis not present

## 2016-05-19 DIAGNOSIS — R202 Paresthesia of skin: Secondary | ICD-10-CM | POA: Diagnosis not present

## 2016-06-27 DIAGNOSIS — G248 Other dystonia: Secondary | ICD-10-CM | POA: Diagnosis not present

## 2016-06-27 DIAGNOSIS — G8929 Other chronic pain: Secondary | ICD-10-CM | POA: Diagnosis not present

## 2016-06-27 DIAGNOSIS — G809 Cerebral palsy, unspecified: Secondary | ICD-10-CM | POA: Diagnosis not present

## 2016-06-27 DIAGNOSIS — G8111 Spastic hemiplegia affecting right dominant side: Secondary | ICD-10-CM | POA: Diagnosis not present

## 2016-06-27 DIAGNOSIS — G802 Spastic hemiplegic cerebral palsy: Secondary | ICD-10-CM | POA: Diagnosis not present

## 2016-06-27 DIAGNOSIS — G249 Dystonia, unspecified: Secondary | ICD-10-CM | POA: Diagnosis not present

## 2016-06-27 DIAGNOSIS — M79621 Pain in right upper arm: Secondary | ICD-10-CM | POA: Diagnosis not present

## 2016-07-03 DIAGNOSIS — Z79899 Other long term (current) drug therapy: Secondary | ICD-10-CM | POA: Diagnosis not present

## 2016-07-03 DIAGNOSIS — E291 Testicular hypofunction: Secondary | ICD-10-CM | POA: Diagnosis not present

## 2016-07-22 DIAGNOSIS — G894 Chronic pain syndrome: Secondary | ICD-10-CM | POA: Diagnosis not present

## 2016-07-22 DIAGNOSIS — Z79891 Long term (current) use of opiate analgesic: Secondary | ICD-10-CM | POA: Diagnosis not present

## 2016-07-29 DIAGNOSIS — Z6832 Body mass index (BMI) 32.0-32.9, adult: Secondary | ICD-10-CM | POA: Diagnosis not present

## 2016-07-29 DIAGNOSIS — L0501 Pilonidal cyst with abscess: Secondary | ICD-10-CM | POA: Diagnosis not present

## 2016-07-31 DIAGNOSIS — G809 Cerebral palsy, unspecified: Secondary | ICD-10-CM | POA: Diagnosis not present

## 2016-07-31 DIAGNOSIS — K613 Ischiorectal abscess: Secondary | ICD-10-CM | POA: Diagnosis not present

## 2016-08-17 DIAGNOSIS — N201 Calculus of ureter: Secondary | ICD-10-CM | POA: Diagnosis not present

## 2016-08-17 DIAGNOSIS — N132 Hydronephrosis with renal and ureteral calculous obstruction: Secondary | ICD-10-CM | POA: Diagnosis not present

## 2016-08-17 DIAGNOSIS — R1084 Generalized abdominal pain: Secondary | ICD-10-CM | POA: Diagnosis not present

## 2016-08-26 DIAGNOSIS — R252 Cramp and spasm: Secondary | ICD-10-CM | POA: Insufficient documentation

## 2016-08-26 DIAGNOSIS — G40109 Localization-related (focal) (partial) symptomatic epilepsy and epileptic syndromes with simple partial seizures, not intractable, without status epilepticus: Secondary | ICD-10-CM

## 2016-08-26 DIAGNOSIS — I63239 Cerebral infarction due to unspecified occlusion or stenosis of unspecified carotid arteries: Secondary | ICD-10-CM | POA: Diagnosis not present

## 2016-08-26 DIAGNOSIS — G809 Cerebral palsy, unspecified: Secondary | ICD-10-CM | POA: Diagnosis not present

## 2016-08-26 HISTORY — DX: Localization-related (focal) (partial) symptomatic epilepsy and epileptic syndromes with simple partial seizures, not intractable, without status epilepticus: G40.109

## 2016-10-06 DIAGNOSIS — G894 Chronic pain syndrome: Secondary | ICD-10-CM | POA: Diagnosis not present

## 2016-10-06 DIAGNOSIS — Z79891 Long term (current) use of opiate analgesic: Secondary | ICD-10-CM | POA: Diagnosis not present

## 2016-12-10 DIAGNOSIS — I10 Essential (primary) hypertension: Secondary | ICD-10-CM | POA: Diagnosis not present

## 2016-12-10 DIAGNOSIS — Z6834 Body mass index (BMI) 34.0-34.9, adult: Secondary | ICD-10-CM | POA: Diagnosis not present

## 2016-12-11 DIAGNOSIS — M50322 Other cervical disc degeneration at C5-C6 level: Secondary | ICD-10-CM | POA: Diagnosis not present

## 2016-12-11 DIAGNOSIS — M62838 Other muscle spasm: Secondary | ICD-10-CM | POA: Diagnosis not present

## 2016-12-11 DIAGNOSIS — Z981 Arthrodesis status: Secondary | ICD-10-CM | POA: Diagnosis not present

## 2016-12-29 DIAGNOSIS — I10 Essential (primary) hypertension: Secondary | ICD-10-CM | POA: Diagnosis not present

## 2016-12-29 DIAGNOSIS — Z8249 Family history of ischemic heart disease and other diseases of the circulatory system: Secondary | ICD-10-CM | POA: Diagnosis not present

## 2016-12-29 DIAGNOSIS — E669 Obesity, unspecified: Secondary | ICD-10-CM | POA: Diagnosis not present

## 2016-12-29 DIAGNOSIS — Z1389 Encounter for screening for other disorder: Secondary | ICD-10-CM | POA: Diagnosis not present

## 2016-12-29 DIAGNOSIS — Z6832 Body mass index (BMI) 32.0-32.9, adult: Secondary | ICD-10-CM | POA: Diagnosis not present

## 2016-12-29 DIAGNOSIS — E785 Hyperlipidemia, unspecified: Secondary | ICD-10-CM | POA: Diagnosis not present

## 2016-12-29 DIAGNOSIS — Z136 Encounter for screening for cardiovascular disorders: Secondary | ICD-10-CM | POA: Diagnosis not present

## 2016-12-29 DIAGNOSIS — Z9181 History of falling: Secondary | ICD-10-CM | POA: Diagnosis not present

## 2016-12-29 DIAGNOSIS — Z6834 Body mass index (BMI) 34.0-34.9, adult: Secondary | ICD-10-CM | POA: Diagnosis not present

## 2016-12-29 DIAGNOSIS — Z0000 Encounter for general adult medical examination without abnormal findings: Secondary | ICD-10-CM | POA: Diagnosis not present

## 2016-12-29 DIAGNOSIS — F419 Anxiety disorder, unspecified: Secondary | ICD-10-CM | POA: Diagnosis not present

## 2017-01-15 DIAGNOSIS — M50322 Other cervical disc degeneration at C5-C6 level: Secondary | ICD-10-CM | POA: Diagnosis not present

## 2017-01-30 DIAGNOSIS — M50322 Other cervical disc degeneration at C5-C6 level: Secondary | ICD-10-CM | POA: Diagnosis not present

## 2017-01-30 DIAGNOSIS — Z981 Arthrodesis status: Secondary | ICD-10-CM | POA: Diagnosis not present

## 2017-01-30 DIAGNOSIS — M62838 Other muscle spasm: Secondary | ICD-10-CM | POA: Diagnosis not present

## 2017-02-02 DIAGNOSIS — Z8673 Personal history of transient ischemic attack (TIA), and cerebral infarction without residual deficits: Secondary | ICD-10-CM | POA: Diagnosis not present

## 2017-02-02 DIAGNOSIS — R072 Precordial pain: Secondary | ICD-10-CM | POA: Diagnosis not present

## 2017-02-02 DIAGNOSIS — I10 Essential (primary) hypertension: Secondary | ICD-10-CM | POA: Diagnosis not present

## 2017-02-02 DIAGNOSIS — Z23 Encounter for immunization: Secondary | ICD-10-CM | POA: Diagnosis not present

## 2017-02-02 DIAGNOSIS — Z79899 Other long term (current) drug therapy: Secondary | ICD-10-CM | POA: Diagnosis not present

## 2017-02-02 DIAGNOSIS — G629 Polyneuropathy, unspecified: Secondary | ICD-10-CM | POA: Diagnosis not present

## 2017-02-02 DIAGNOSIS — R079 Chest pain, unspecified: Secondary | ICD-10-CM | POA: Diagnosis not present

## 2017-02-02 DIAGNOSIS — R0602 Shortness of breath: Secondary | ICD-10-CM | POA: Diagnosis not present

## 2017-02-03 DIAGNOSIS — I10 Essential (primary) hypertension: Secondary | ICD-10-CM | POA: Diagnosis not present

## 2017-02-03 DIAGNOSIS — R079 Chest pain, unspecified: Secondary | ICD-10-CM | POA: Diagnosis not present

## 2017-02-03 DIAGNOSIS — G629 Polyneuropathy, unspecified: Secondary | ICD-10-CM | POA: Diagnosis not present

## 2017-02-04 DIAGNOSIS — Z981 Arthrodesis status: Secondary | ICD-10-CM | POA: Diagnosis not present

## 2017-02-04 DIAGNOSIS — M50322 Other cervical disc degeneration at C5-C6 level: Secondary | ICD-10-CM | POA: Diagnosis not present

## 2017-02-05 DIAGNOSIS — Z6834 Body mass index (BMI) 34.0-34.9, adult: Secondary | ICD-10-CM | POA: Diagnosis not present

## 2017-02-05 DIAGNOSIS — I10 Essential (primary) hypertension: Secondary | ICD-10-CM | POA: Diagnosis not present

## 2017-02-05 DIAGNOSIS — R079 Chest pain, unspecified: Secondary | ICD-10-CM | POA: Diagnosis not present

## 2017-02-05 DIAGNOSIS — Z79899 Other long term (current) drug therapy: Secondary | ICD-10-CM | POA: Diagnosis not present

## 2017-02-05 DIAGNOSIS — K2900 Acute gastritis without bleeding: Secondary | ICD-10-CM | POA: Diagnosis not present

## 2017-02-26 DIAGNOSIS — Z8673 Personal history of transient ischemic attack (TIA), and cerebral infarction without residual deficits: Secondary | ICD-10-CM | POA: Diagnosis not present

## 2017-02-26 DIAGNOSIS — G802 Spastic hemiplegic cerebral palsy: Secondary | ICD-10-CM | POA: Diagnosis not present

## 2017-02-26 DIAGNOSIS — R002 Palpitations: Secondary | ICD-10-CM | POA: Diagnosis not present

## 2017-02-26 DIAGNOSIS — R61 Generalized hyperhidrosis: Secondary | ICD-10-CM | POA: Diagnosis not present

## 2017-02-26 DIAGNOSIS — R0789 Other chest pain: Secondary | ICD-10-CM | POA: Diagnosis not present

## 2017-02-26 DIAGNOSIS — Z8249 Family history of ischemic heart disease and other diseases of the circulatory system: Secondary | ICD-10-CM | POA: Diagnosis not present

## 2017-03-05 DIAGNOSIS — Z8673 Personal history of transient ischemic attack (TIA), and cerebral infarction without residual deficits: Secondary | ICD-10-CM | POA: Diagnosis not present

## 2017-03-05 DIAGNOSIS — R002 Palpitations: Secondary | ICD-10-CM | POA: Diagnosis not present

## 2017-03-05 DIAGNOSIS — R0789 Other chest pain: Secondary | ICD-10-CM | POA: Diagnosis not present

## 2017-03-09 DIAGNOSIS — R002 Palpitations: Secondary | ICD-10-CM | POA: Diagnosis not present

## 2017-03-09 DIAGNOSIS — Z6834 Body mass index (BMI) 34.0-34.9, adult: Secondary | ICD-10-CM | POA: Diagnosis not present

## 2017-03-09 DIAGNOSIS — I10 Essential (primary) hypertension: Secondary | ICD-10-CM | POA: Diagnosis not present

## 2017-03-18 DIAGNOSIS — R0789 Other chest pain: Secondary | ICD-10-CM | POA: Diagnosis not present

## 2017-03-18 DIAGNOSIS — R002 Palpitations: Secondary | ICD-10-CM | POA: Diagnosis not present

## 2017-03-25 DIAGNOSIS — R0789 Other chest pain: Secondary | ICD-10-CM | POA: Diagnosis not present

## 2017-03-25 DIAGNOSIS — R61 Generalized hyperhidrosis: Secondary | ICD-10-CM | POA: Diagnosis not present

## 2017-03-25 DIAGNOSIS — G802 Spastic hemiplegic cerebral palsy: Secondary | ICD-10-CM | POA: Diagnosis not present

## 2017-03-25 DIAGNOSIS — R002 Palpitations: Secondary | ICD-10-CM | POA: Diagnosis not present

## 2017-05-04 DIAGNOSIS — M546 Pain in thoracic spine: Secondary | ICD-10-CM | POA: Diagnosis not present

## 2017-05-04 DIAGNOSIS — M50322 Other cervical disc degeneration at C5-C6 level: Secondary | ICD-10-CM | POA: Diagnosis not present

## 2017-05-04 DIAGNOSIS — S134XXS Sprain of ligaments of cervical spine, sequela: Secondary | ICD-10-CM | POA: Diagnosis not present

## 2017-05-04 DIAGNOSIS — M5136 Other intervertebral disc degeneration, lumbar region: Secondary | ICD-10-CM | POA: Diagnosis not present

## 2017-10-10 DIAGNOSIS — G809 Cerebral palsy, unspecified: Secondary | ICD-10-CM | POA: Diagnosis not present

## 2017-10-10 DIAGNOSIS — N23 Unspecified renal colic: Secondary | ICD-10-CM | POA: Diagnosis not present

## 2017-10-10 DIAGNOSIS — Z9049 Acquired absence of other specified parts of digestive tract: Secondary | ICD-10-CM | POA: Diagnosis not present

## 2017-10-10 DIAGNOSIS — N201 Calculus of ureter: Secondary | ICD-10-CM | POA: Diagnosis not present

## 2017-10-10 DIAGNOSIS — R1084 Generalized abdominal pain: Secondary | ICD-10-CM | POA: Diagnosis not present

## 2017-10-10 DIAGNOSIS — Z87442 Personal history of urinary calculi: Secondary | ICD-10-CM | POA: Diagnosis not present

## 2017-10-10 DIAGNOSIS — E669 Obesity, unspecified: Secondary | ICD-10-CM | POA: Diagnosis not present

## 2017-10-10 DIAGNOSIS — N200 Calculus of kidney: Secondary | ICD-10-CM | POA: Diagnosis not present

## 2017-11-23 DIAGNOSIS — R300 Dysuria: Secondary | ICD-10-CM | POA: Diagnosis not present

## 2017-11-23 DIAGNOSIS — I10 Essential (primary) hypertension: Secondary | ICD-10-CM | POA: Diagnosis not present

## 2017-11-23 DIAGNOSIS — Z6836 Body mass index (BMI) 36.0-36.9, adult: Secondary | ICD-10-CM | POA: Diagnosis not present

## 2018-01-07 DIAGNOSIS — Z9181 History of falling: Secondary | ICD-10-CM | POA: Diagnosis not present

## 2018-01-07 DIAGNOSIS — Z0000 Encounter for general adult medical examination without abnormal findings: Secondary | ICD-10-CM | POA: Diagnosis not present

## 2018-01-07 DIAGNOSIS — Z136 Encounter for screening for cardiovascular disorders: Secondary | ICD-10-CM | POA: Diagnosis not present

## 2018-01-07 DIAGNOSIS — E785 Hyperlipidemia, unspecified: Secondary | ICD-10-CM | POA: Diagnosis not present

## 2018-01-07 DIAGNOSIS — Z6837 Body mass index (BMI) 37.0-37.9, adult: Secondary | ICD-10-CM | POA: Diagnosis not present

## 2018-01-07 DIAGNOSIS — Z1331 Encounter for screening for depression: Secondary | ICD-10-CM | POA: Diagnosis not present

## 2018-01-07 DIAGNOSIS — Z125 Encounter for screening for malignant neoplasm of prostate: Secondary | ICD-10-CM | POA: Diagnosis not present

## 2018-02-12 ENCOUNTER — Encounter: Payer: Self-pay | Admitting: Gastroenterology

## 2018-02-17 DIAGNOSIS — K2900 Acute gastritis without bleeding: Secondary | ICD-10-CM | POA: Diagnosis not present

## 2018-02-17 DIAGNOSIS — R1013 Epigastric pain: Secondary | ICD-10-CM | POA: Diagnosis not present

## 2018-02-17 DIAGNOSIS — K582 Mixed irritable bowel syndrome: Secondary | ICD-10-CM | POA: Diagnosis not present

## 2018-02-17 DIAGNOSIS — Z6836 Body mass index (BMI) 36.0-36.9, adult: Secondary | ICD-10-CM | POA: Diagnosis not present

## 2018-02-17 DIAGNOSIS — R945 Abnormal results of liver function studies: Secondary | ICD-10-CM | POA: Diagnosis not present

## 2018-02-19 DIAGNOSIS — B169 Acute hepatitis B without delta-agent and without hepatic coma: Secondary | ICD-10-CM | POA: Diagnosis not present

## 2018-02-26 DIAGNOSIS — R945 Abnormal results of liver function studies: Secondary | ICD-10-CM | POA: Diagnosis not present

## 2018-03-02 DIAGNOSIS — R945 Abnormal results of liver function studies: Secondary | ICD-10-CM | POA: Diagnosis not present

## 2018-03-09 DIAGNOSIS — R945 Abnormal results of liver function studies: Secondary | ICD-10-CM | POA: Diagnosis not present

## 2018-03-15 DIAGNOSIS — B1910 Unspecified viral hepatitis B without hepatic coma: Secondary | ICD-10-CM | POA: Diagnosis not present

## 2018-03-30 DIAGNOSIS — B169 Acute hepatitis B without delta-agent and without hepatic coma: Secondary | ICD-10-CM | POA: Diagnosis not present

## 2018-03-30 DIAGNOSIS — R768 Other specified abnormal immunological findings in serum: Secondary | ICD-10-CM | POA: Diagnosis not present

## 2018-04-25 DIAGNOSIS — R079 Chest pain, unspecified: Secondary | ICD-10-CM | POA: Diagnosis not present

## 2018-06-29 DIAGNOSIS — R1032 Left lower quadrant pain: Secondary | ICD-10-CM | POA: Diagnosis not present

## 2018-06-29 DIAGNOSIS — N200 Calculus of kidney: Secondary | ICD-10-CM | POA: Diagnosis not present

## 2018-07-05 DIAGNOSIS — Z6836 Body mass index (BMI) 36.0-36.9, adult: Secondary | ICD-10-CM | POA: Diagnosis not present

## 2018-07-05 DIAGNOSIS — N200 Calculus of kidney: Secondary | ICD-10-CM | POA: Diagnosis not present

## 2018-07-05 DIAGNOSIS — M50322 Other cervical disc degeneration at C5-C6 level: Secondary | ICD-10-CM | POA: Diagnosis not present

## 2018-07-06 DIAGNOSIS — Z87442 Personal history of urinary calculi: Secondary | ICD-10-CM | POA: Diagnosis not present

## 2018-07-06 DIAGNOSIS — Z79899 Other long term (current) drug therapy: Secondary | ICD-10-CM | POA: Diagnosis not present

## 2018-07-06 DIAGNOSIS — R3129 Other microscopic hematuria: Secondary | ICD-10-CM | POA: Diagnosis not present

## 2018-07-06 DIAGNOSIS — R1031 Right lower quadrant pain: Secondary | ICD-10-CM | POA: Diagnosis not present

## 2018-07-06 DIAGNOSIS — N200 Calculus of kidney: Secondary | ICD-10-CM | POA: Diagnosis not present

## 2018-07-06 DIAGNOSIS — R1032 Left lower quadrant pain: Secondary | ICD-10-CM | POA: Diagnosis not present

## 2018-07-09 DIAGNOSIS — M4802 Spinal stenosis, cervical region: Secondary | ICD-10-CM | POA: Diagnosis not present

## 2018-07-21 DIAGNOSIS — M542 Cervicalgia: Secondary | ICD-10-CM | POA: Diagnosis not present

## 2018-07-23 DIAGNOSIS — M5412 Radiculopathy, cervical region: Secondary | ICD-10-CM | POA: Diagnosis not present

## 2018-07-23 DIAGNOSIS — R252 Cramp and spasm: Secondary | ICD-10-CM | POA: Diagnosis not present

## 2018-12-30 DIAGNOSIS — K219 Gastro-esophageal reflux disease without esophagitis: Secondary | ICD-10-CM | POA: Diagnosis not present

## 2018-12-30 DIAGNOSIS — I10 Essential (primary) hypertension: Secondary | ICD-10-CM | POA: Diagnosis not present

## 2018-12-30 DIAGNOSIS — F419 Anxiety disorder, unspecified: Secondary | ICD-10-CM | POA: Diagnosis not present

## 2019-01-11 DIAGNOSIS — Z0000 Encounter for general adult medical examination without abnormal findings: Secondary | ICD-10-CM | POA: Diagnosis not present

## 2019-01-11 DIAGNOSIS — Z9181 History of falling: Secondary | ICD-10-CM | POA: Diagnosis not present

## 2019-01-11 DIAGNOSIS — Z1331 Encounter for screening for depression: Secondary | ICD-10-CM | POA: Diagnosis not present

## 2019-01-11 DIAGNOSIS — E785 Hyperlipidemia, unspecified: Secondary | ICD-10-CM | POA: Diagnosis not present

## 2019-08-29 DIAGNOSIS — R6883 Chills (without fever): Secondary | ICD-10-CM | POA: Diagnosis not present

## 2019-08-29 DIAGNOSIS — R05 Cough: Secondary | ICD-10-CM | POA: Diagnosis not present

## 2019-08-29 DIAGNOSIS — R6889 Other general symptoms and signs: Secondary | ICD-10-CM | POA: Diagnosis not present

## 2019-08-29 DIAGNOSIS — Z20828 Contact with and (suspected) exposure to other viral communicable diseases: Secondary | ICD-10-CM | POA: Diagnosis not present

## 2019-10-20 DIAGNOSIS — I10 Essential (primary) hypertension: Secondary | ICD-10-CM | POA: Diagnosis not present

## 2019-10-20 DIAGNOSIS — E669 Obesity, unspecified: Secondary | ICD-10-CM | POA: Diagnosis not present

## 2019-10-21 DIAGNOSIS — E669 Obesity, unspecified: Secondary | ICD-10-CM | POA: Diagnosis not present

## 2020-02-27 DIAGNOSIS — H538 Other visual disturbances: Secondary | ICD-10-CM | POA: Diagnosis not present

## 2020-02-27 DIAGNOSIS — R519 Headache, unspecified: Secondary | ICD-10-CM | POA: Diagnosis not present

## 2020-02-27 DIAGNOSIS — G43809 Other migraine, not intractable, without status migrainosus: Secondary | ICD-10-CM | POA: Diagnosis not present

## 2020-02-27 DIAGNOSIS — G43909 Migraine, unspecified, not intractable, without status migrainosus: Secondary | ICD-10-CM | POA: Diagnosis not present

## 2020-03-07 DIAGNOSIS — G43919 Migraine, unspecified, intractable, without status migrainosus: Secondary | ICD-10-CM | POA: Diagnosis not present

## 2020-03-07 DIAGNOSIS — R6889 Other general symptoms and signs: Secondary | ICD-10-CM | POA: Diagnosis not present

## 2020-03-08 DIAGNOSIS — B9689 Other specified bacterial agents as the cause of diseases classified elsewhere: Secondary | ICD-10-CM | POA: Diagnosis not present

## 2020-03-08 DIAGNOSIS — G43019 Migraine without aura, intractable, without status migrainosus: Secondary | ICD-10-CM | POA: Diagnosis not present

## 2020-03-08 DIAGNOSIS — J208 Acute bronchitis due to other specified organisms: Secondary | ICD-10-CM | POA: Diagnosis not present

## 2020-04-20 DIAGNOSIS — M542 Cervicalgia: Secondary | ICD-10-CM | POA: Diagnosis not present

## 2020-04-20 DIAGNOSIS — M5412 Radiculopathy, cervical region: Secondary | ICD-10-CM | POA: Diagnosis not present

## 2020-04-20 DIAGNOSIS — G8929 Other chronic pain: Secondary | ICD-10-CM | POA: Insufficient documentation

## 2020-04-20 HISTORY — DX: Other chronic pain: G89.29

## 2020-05-02 DIAGNOSIS — M542 Cervicalgia: Secondary | ICD-10-CM | POA: Diagnosis not present

## 2020-05-30 DIAGNOSIS — M545 Low back pain: Secondary | ICD-10-CM | POA: Diagnosis not present

## 2020-05-30 DIAGNOSIS — Z041 Encounter for examination and observation following transport accident: Secondary | ICD-10-CM | POA: Diagnosis not present

## 2020-05-30 DIAGNOSIS — S5012XA Contusion of left forearm, initial encounter: Secondary | ICD-10-CM | POA: Diagnosis not present

## 2020-05-30 DIAGNOSIS — Z981 Arthrodesis status: Secondary | ICD-10-CM | POA: Diagnosis not present

## 2020-05-30 DIAGNOSIS — M546 Pain in thoracic spine: Secondary | ICD-10-CM | POA: Diagnosis not present

## 2020-05-30 DIAGNOSIS — M25511 Pain in right shoulder: Secondary | ICD-10-CM | POA: Diagnosis not present

## 2020-05-30 DIAGNOSIS — S22080A Wedge compression fracture of T11-T12 vertebra, initial encounter for closed fracture: Secondary | ICD-10-CM | POA: Diagnosis not present

## 2020-05-30 DIAGNOSIS — M4854XA Collapsed vertebra, not elsewhere classified, thoracic region, initial encounter for fracture: Secondary | ICD-10-CM | POA: Diagnosis not present

## 2020-06-07 DIAGNOSIS — M5417 Radiculopathy, lumbosacral region: Secondary | ICD-10-CM | POA: Diagnosis not present

## 2020-07-04 DIAGNOSIS — R6889 Other general symptoms and signs: Secondary | ICD-10-CM | POA: Diagnosis not present

## 2020-07-09 DIAGNOSIS — R079 Chest pain, unspecified: Secondary | ICD-10-CM | POA: Diagnosis not present

## 2020-07-09 DIAGNOSIS — U071 COVID-19: Secondary | ICD-10-CM | POA: Diagnosis not present

## 2020-07-09 DIAGNOSIS — R0789 Other chest pain: Secondary | ICD-10-CM | POA: Diagnosis not present

## 2020-07-09 DIAGNOSIS — G809 Cerebral palsy, unspecified: Secondary | ICD-10-CM | POA: Diagnosis not present

## 2020-07-09 DIAGNOSIS — I10 Essential (primary) hypertension: Secondary | ICD-10-CM | POA: Diagnosis not present

## 2020-07-09 DIAGNOSIS — R918 Other nonspecific abnormal finding of lung field: Secondary | ICD-10-CM | POA: Diagnosis not present

## 2020-07-09 DIAGNOSIS — J1282 Pneumonia due to coronavirus disease 2019: Secondary | ICD-10-CM | POA: Diagnosis not present

## 2020-07-09 DIAGNOSIS — R0602 Shortness of breath: Secondary | ICD-10-CM | POA: Diagnosis not present

## 2020-07-10 DIAGNOSIS — U071 COVID-19: Secondary | ICD-10-CM | POA: Insufficient documentation

## 2020-07-10 HISTORY — DX: COVID-19: U07.1

## 2020-07-11 DIAGNOSIS — U071 COVID-19: Secondary | ICD-10-CM | POA: Diagnosis not present

## 2020-07-11 DIAGNOSIS — Z23 Encounter for immunization: Secondary | ICD-10-CM | POA: Diagnosis not present

## 2021-06-04 DIAGNOSIS — M545 Low back pain, unspecified: Secondary | ICD-10-CM | POA: Diagnosis not present

## 2021-06-04 DIAGNOSIS — R5382 Chronic fatigue, unspecified: Secondary | ICD-10-CM | POA: Diagnosis not present

## 2021-07-10 DIAGNOSIS — Z9181 History of falling: Secondary | ICD-10-CM | POA: Diagnosis not present

## 2021-07-10 DIAGNOSIS — Z0000 Encounter for general adult medical examination without abnormal findings: Secondary | ICD-10-CM | POA: Diagnosis not present

## 2021-07-10 DIAGNOSIS — Z1331 Encounter for screening for depression: Secondary | ICD-10-CM | POA: Diagnosis not present

## 2021-07-10 DIAGNOSIS — E785 Hyperlipidemia, unspecified: Secondary | ICD-10-CM | POA: Diagnosis not present

## 2021-07-14 DIAGNOSIS — R0789 Other chest pain: Secondary | ICD-10-CM | POA: Diagnosis not present

## 2021-07-14 DIAGNOSIS — Z8673 Personal history of transient ischemic attack (TIA), and cerebral infarction without residual deficits: Secondary | ICD-10-CM | POA: Diagnosis not present

## 2021-07-14 DIAGNOSIS — R0600 Dyspnea, unspecified: Secondary | ICD-10-CM | POA: Diagnosis not present

## 2021-07-14 DIAGNOSIS — I169 Hypertensive crisis, unspecified: Secondary | ICD-10-CM | POA: Diagnosis not present

## 2021-07-14 DIAGNOSIS — R079 Chest pain, unspecified: Secondary | ICD-10-CM | POA: Diagnosis not present

## 2021-07-14 DIAGNOSIS — G809 Cerebral palsy, unspecified: Secondary | ICD-10-CM | POA: Diagnosis not present

## 2021-07-14 DIAGNOSIS — G40909 Epilepsy, unspecified, not intractable, without status epilepticus: Secondary | ICD-10-CM | POA: Diagnosis not present

## 2021-07-15 DIAGNOSIS — R079 Chest pain, unspecified: Secondary | ICD-10-CM | POA: Diagnosis not present

## 2021-07-15 DIAGNOSIS — I169 Hypertensive crisis, unspecified: Secondary | ICD-10-CM | POA: Diagnosis not present

## 2021-07-15 DIAGNOSIS — I639 Cerebral infarction, unspecified: Secondary | ICD-10-CM | POA: Diagnosis not present

## 2021-07-15 DIAGNOSIS — G809 Cerebral palsy, unspecified: Secondary | ICD-10-CM | POA: Diagnosis not present

## 2021-07-16 DIAGNOSIS — R079 Chest pain, unspecified: Secondary | ICD-10-CM | POA: Diagnosis not present

## 2021-07-16 DIAGNOSIS — G809 Cerebral palsy, unspecified: Secondary | ICD-10-CM | POA: Diagnosis present

## 2021-07-16 DIAGNOSIS — G40909 Epilepsy, unspecified, not intractable, without status epilepticus: Secondary | ICD-10-CM | POA: Diagnosis present

## 2021-07-16 DIAGNOSIS — Z8673 Personal history of transient ischemic attack (TIA), and cerebral infarction without residual deficits: Secondary | ICD-10-CM | POA: Diagnosis not present

## 2021-07-16 DIAGNOSIS — I169 Hypertensive crisis, unspecified: Secondary | ICD-10-CM | POA: Diagnosis present

## 2021-07-18 DIAGNOSIS — K219 Gastro-esophageal reflux disease without esophagitis: Secondary | ICD-10-CM | POA: Diagnosis not present

## 2021-07-18 DIAGNOSIS — I10 Essential (primary) hypertension: Secondary | ICD-10-CM | POA: Diagnosis not present

## 2021-07-18 DIAGNOSIS — E785 Hyperlipidemia, unspecified: Secondary | ICD-10-CM | POA: Diagnosis not present

## 2021-07-18 DIAGNOSIS — Z23 Encounter for immunization: Secondary | ICD-10-CM | POA: Diagnosis not present

## 2021-07-18 DIAGNOSIS — Z125 Encounter for screening for malignant neoplasm of prostate: Secondary | ICD-10-CM | POA: Diagnosis not present

## 2021-07-22 DIAGNOSIS — I10 Essential (primary) hypertension: Secondary | ICD-10-CM | POA: Diagnosis not present

## 2021-09-27 DIAGNOSIS — L989 Disorder of the skin and subcutaneous tissue, unspecified: Secondary | ICD-10-CM | POA: Diagnosis not present

## 2022-01-28 DIAGNOSIS — H5213 Myopia, bilateral: Secondary | ICD-10-CM | POA: Diagnosis not present

## 2022-04-16 DIAGNOSIS — N1330 Unspecified hydronephrosis: Secondary | ICD-10-CM | POA: Diagnosis not present

## 2022-04-16 DIAGNOSIS — Z9049 Acquired absence of other specified parts of digestive tract: Secondary | ICD-10-CM | POA: Diagnosis not present

## 2022-04-16 DIAGNOSIS — Z87442 Personal history of urinary calculi: Secondary | ICD-10-CM | POA: Diagnosis not present

## 2022-04-16 DIAGNOSIS — N201 Calculus of ureter: Secondary | ICD-10-CM | POA: Diagnosis not present

## 2022-04-16 DIAGNOSIS — N200 Calculus of kidney: Secondary | ICD-10-CM | POA: Diagnosis not present

## 2022-04-22 DIAGNOSIS — N132 Hydronephrosis with renal and ureteral calculous obstruction: Secondary | ICD-10-CM | POA: Diagnosis not present

## 2022-04-22 DIAGNOSIS — N200 Calculus of kidney: Secondary | ICD-10-CM | POA: Diagnosis not present

## 2022-04-23 DIAGNOSIS — N210 Calculus in bladder: Secondary | ICD-10-CM | POA: Diagnosis not present

## 2022-04-23 DIAGNOSIS — N201 Calculus of ureter: Secondary | ICD-10-CM | POA: Diagnosis not present

## 2022-04-23 DIAGNOSIS — N132 Hydronephrosis with renal and ureteral calculous obstruction: Secondary | ICD-10-CM | POA: Diagnosis not present

## 2022-04-23 DIAGNOSIS — K449 Diaphragmatic hernia without obstruction or gangrene: Secondary | ICD-10-CM | POA: Diagnosis not present

## 2022-04-23 DIAGNOSIS — R350 Frequency of micturition: Secondary | ICD-10-CM | POA: Diagnosis not present

## 2022-04-23 DIAGNOSIS — Z9049 Acquired absence of other specified parts of digestive tract: Secondary | ICD-10-CM | POA: Diagnosis not present

## 2022-11-21 DIAGNOSIS — R079 Chest pain, unspecified: Secondary | ICD-10-CM | POA: Diagnosis not present

## 2022-11-21 DIAGNOSIS — I10 Essential (primary) hypertension: Secondary | ICD-10-CM | POA: Diagnosis not present

## 2022-11-21 DIAGNOSIS — U071 COVID-19: Secondary | ICD-10-CM | POA: Diagnosis not present

## 2022-11-21 DIAGNOSIS — R0789 Other chest pain: Secondary | ICD-10-CM | POA: Diagnosis not present

## 2022-11-21 DIAGNOSIS — Z91148 Patient's other noncompliance with medication regimen for other reason: Secondary | ICD-10-CM | POA: Diagnosis not present

## 2022-11-21 DIAGNOSIS — E860 Dehydration: Secondary | ICD-10-CM | POA: Diagnosis not present

## 2022-11-21 DIAGNOSIS — R0602 Shortness of breath: Secondary | ICD-10-CM | POA: Diagnosis not present

## 2023-01-04 DIAGNOSIS — Z8673 Personal history of transient ischemic attack (TIA), and cerebral infarction without residual deficits: Secondary | ICD-10-CM | POA: Diagnosis not present

## 2023-01-04 DIAGNOSIS — E782 Mixed hyperlipidemia: Secondary | ICD-10-CM | POA: Diagnosis not present

## 2023-01-04 DIAGNOSIS — G809 Cerebral palsy, unspecified: Secondary | ICD-10-CM | POA: Diagnosis not present

## 2023-01-04 DIAGNOSIS — R079 Chest pain, unspecified: Secondary | ICD-10-CM | POA: Diagnosis not present

## 2023-01-04 DIAGNOSIS — I214 Non-ST elevation (NSTEMI) myocardial infarction: Secondary | ICD-10-CM | POA: Diagnosis not present

## 2023-01-04 DIAGNOSIS — I10 Essential (primary) hypertension: Secondary | ICD-10-CM | POA: Diagnosis not present

## 2023-01-04 DIAGNOSIS — R0789 Other chest pain: Secondary | ICD-10-CM | POA: Diagnosis not present

## 2023-01-04 DIAGNOSIS — I517 Cardiomegaly: Secondary | ICD-10-CM | POA: Diagnosis not present

## 2023-01-04 DIAGNOSIS — E785 Hyperlipidemia, unspecified: Secondary | ICD-10-CM | POA: Diagnosis not present

## 2023-01-04 DIAGNOSIS — H538 Other visual disturbances: Secondary | ICD-10-CM | POA: Diagnosis not present

## 2023-01-04 DIAGNOSIS — I2510 Atherosclerotic heart disease of native coronary artery without angina pectoris: Secondary | ICD-10-CM | POA: Diagnosis not present

## 2023-01-04 DIAGNOSIS — R200 Anesthesia of skin: Secondary | ICD-10-CM | POA: Diagnosis not present

## 2023-01-04 DIAGNOSIS — M25551 Pain in right hip: Secondary | ICD-10-CM | POA: Diagnosis not present

## 2023-01-04 DIAGNOSIS — R778 Other specified abnormalities of plasma proteins: Secondary | ICD-10-CM | POA: Diagnosis not present

## 2023-01-04 DIAGNOSIS — R2990 Unspecified symptoms and signs involving the nervous system: Secondary | ICD-10-CM | POA: Diagnosis not present

## 2023-01-04 HISTORY — PX: CORONARY ANGIOPLASTY: SHX604

## 2023-01-05 DIAGNOSIS — I517 Cardiomegaly: Secondary | ICD-10-CM | POA: Diagnosis not present

## 2023-01-06 DIAGNOSIS — R2990 Unspecified symptoms and signs involving the nervous system: Secondary | ICD-10-CM | POA: Diagnosis not present

## 2023-01-07 DIAGNOSIS — R2990 Unspecified symptoms and signs involving the nervous system: Secondary | ICD-10-CM | POA: Diagnosis not present

## 2023-01-15 DIAGNOSIS — R079 Chest pain, unspecified: Secondary | ICD-10-CM | POA: Diagnosis not present

## 2023-01-15 DIAGNOSIS — E785 Hyperlipidemia, unspecified: Secondary | ICD-10-CM | POA: Diagnosis not present

## 2023-01-15 DIAGNOSIS — I10 Essential (primary) hypertension: Secondary | ICD-10-CM | POA: Diagnosis not present

## 2023-01-15 DIAGNOSIS — R2990 Unspecified symptoms and signs involving the nervous system: Secondary | ICD-10-CM | POA: Diagnosis not present

## 2023-01-15 DIAGNOSIS — K219 Gastro-esophageal reflux disease without esophagitis: Secondary | ICD-10-CM | POA: Diagnosis not present

## 2023-01-15 DIAGNOSIS — I214 Non-ST elevation (NSTEMI) myocardial infarction: Secondary | ICD-10-CM | POA: Diagnosis not present

## 2023-01-16 ENCOUNTER — Observation Stay (HOSPITAL_COMMUNITY)
Admission: EM | Admit: 2023-01-16 | Discharge: 2023-01-17 | Disposition: A | Discharge status: Home / Self Care | Payer: 59 | Attending: Internal Medicine | Admitting: Internal Medicine

## 2023-01-16 ENCOUNTER — Emergency Department (HOSPITAL_COMMUNITY): Payer: 59

## 2023-01-16 ENCOUNTER — Other Ambulatory Visit: Payer: Self-pay

## 2023-01-16 DIAGNOSIS — I160 Hypertensive urgency: Secondary | ICD-10-CM | POA: Insufficient documentation

## 2023-01-16 DIAGNOSIS — Z87891 Personal history of nicotine dependence: Secondary | ICD-10-CM | POA: Diagnosis not present

## 2023-01-16 DIAGNOSIS — Z7982 Long term (current) use of aspirin: Secondary | ICD-10-CM | POA: Diagnosis not present

## 2023-01-16 DIAGNOSIS — E782 Mixed hyperlipidemia: Secondary | ICD-10-CM | POA: Diagnosis not present

## 2023-01-16 DIAGNOSIS — Z955 Presence of coronary angioplasty implant and graft: Secondary | ICD-10-CM | POA: Diagnosis not present

## 2023-01-16 DIAGNOSIS — R079 Chest pain, unspecified: Secondary | ICD-10-CM | POA: Diagnosis not present

## 2023-01-16 DIAGNOSIS — R200 Anesthesia of skin: Secondary | ICD-10-CM | POA: Diagnosis not present

## 2023-01-16 DIAGNOSIS — I10 Essential (primary) hypertension: Secondary | ICD-10-CM

## 2023-01-16 DIAGNOSIS — I252 Old myocardial infarction: Secondary | ICD-10-CM | POA: Diagnosis not present

## 2023-01-16 DIAGNOSIS — Z79899 Other long term (current) drug therapy: Secondary | ICD-10-CM | POA: Diagnosis not present

## 2023-01-16 DIAGNOSIS — Z8673 Personal history of transient ischemic attack (TIA), and cerebral infarction without residual deficits: Secondary | ICD-10-CM | POA: Diagnosis not present

## 2023-01-16 DIAGNOSIS — I2081 Angina pectoris with coronary microvascular dysfunction: Secondary | ICD-10-CM

## 2023-01-16 DIAGNOSIS — I2510 Atherosclerotic heart disease of native coronary artery without angina pectoris: Secondary | ICD-10-CM | POA: Diagnosis not present

## 2023-01-16 DIAGNOSIS — R072 Precordial pain: Secondary | ICD-10-CM | POA: Diagnosis not present

## 2023-01-16 DIAGNOSIS — I2584 Coronary atherosclerosis due to calcified coronary lesion: Secondary | ICD-10-CM | POA: Diagnosis not present

## 2023-01-16 DIAGNOSIS — R0789 Other chest pain: Secondary | ICD-10-CM | POA: Diagnosis not present

## 2023-01-16 DIAGNOSIS — G809 Cerebral palsy, unspecified: Secondary | ICD-10-CM

## 2023-01-16 DIAGNOSIS — E785 Hyperlipidemia, unspecified: Secondary | ICD-10-CM | POA: Diagnosis not present

## 2023-01-16 HISTORY — DX: Essential (primary) hypertension: I10

## 2023-01-16 HISTORY — DX: Atherosclerotic heart disease of native coronary artery without angina pectoris: I25.10

## 2023-01-16 HISTORY — DX: Old myocardial infarction: I25.2

## 2023-01-16 HISTORY — DX: Presence of coronary angioplasty implant and graft: Z95.5

## 2023-01-16 HISTORY — DX: Mixed hyperlipidemia: E78.2

## 2023-01-16 LAB — CBC WITH DIFFERENTIAL/PLATELET
Abs Immature Granulocytes: 0.01 10*3/uL (ref 0.00–0.07)
Basophils Absolute: 0 10*3/uL (ref 0.0–0.1)
Basophils Relative: 1 %
Eosinophils Absolute: 0.1 10*3/uL (ref 0.0–0.5)
Eosinophils Relative: 2 %
HCT: 43.4 % (ref 39.0–52.0)
Hemoglobin: 14.9 g/dL (ref 13.0–17.0)
Immature Granulocytes: 0 %
Lymphocytes Relative: 21 %
Lymphs Abs: 1.5 10*3/uL (ref 0.7–4.0)
MCH: 32.1 pg (ref 26.0–34.0)
MCHC: 34.3 g/dL (ref 30.0–36.0)
MCV: 93.5 fL (ref 80.0–100.0)
Monocytes Absolute: 0.4 10*3/uL (ref 0.1–1.0)
Monocytes Relative: 5 %
Neutro Abs: 5.3 10*3/uL (ref 1.7–7.7)
Neutrophils Relative %: 71 %
Platelets: 292 10*3/uL (ref 150–400)
RBC: 4.64 MIL/uL (ref 4.22–5.81)
RDW: 12.7 % (ref 11.5–15.5)
WBC: 7.4 10*3/uL (ref 4.0–10.5)
nRBC: 0 % (ref 0.0–0.2)

## 2023-01-16 LAB — TROPONIN I (HIGH SENSITIVITY)
Troponin I (High Sensitivity): 6 ng/L (ref <18)
Troponin I (High Sensitivity): 5 ng/L (ref <18)

## 2023-01-16 LAB — BASIC METABOLIC PANEL
Anion gap: 9 (ref 5–15)
BUN: 11 mg/dL (ref 6–20)
CO2: 23 mmol/L (ref 22–32)
Calcium: 8.9 mg/dL (ref 8.9–10.3)
Chloride: 105 mmol/L (ref 98–111)
Creatinine, Ser: 0.85 mg/dL (ref 0.61–1.24)
GFR, Estimated: >60 mL/min (ref >60)
Glucose, Bld: 105 mg/dL — ABNORMAL HIGH (ref 70–99)
Potassium: 4 mmol/L (ref 3.5–5.1)
Sodium: 137 mmol/L (ref 135–145)

## 2023-01-16 LAB — I-STAT CHEM 8, ED
BUN: 11 mg/dL (ref 6–20)
Calcium, Ion: 1.22 mmol/L (ref 1.15–1.40)
Chloride: 104 mmol/L (ref 98–111)
Creatinine, Ser: 0.7 mg/dL (ref 0.61–1.24)
Glucose, Bld: 106 mg/dL — ABNORMAL HIGH (ref 70–99)
HCT: 40 % (ref 39.0–52.0)
Hemoglobin: 13.6 g/dL (ref 13.0–17.0)
Potassium: 4 mmol/L (ref 3.5–5.1)
Sodium: 138 mmol/L (ref 135–145)
TCO2: 24 mmol/L (ref 22–32)

## 2023-01-16 LAB — HIV ANTIBODY (ROUTINE TESTING W REFLEX): HIV Screen 4th Generation wRfx: NONREACTIVE

## 2023-01-16 LAB — RAPID URINE DRUG SCREEN, HOSP PERFORMED
Amphetamines: NOT DETECTED
Barbiturates: NOT DETECTED
Benzodiazepines: NOT DETECTED
Cocaine: NOT DETECTED
Opiates: POSITIVE — AB
Tetrahydrocannabinol: NOT DETECTED

## 2023-01-16 MED ORDER — CARVEDILOL 6.25 MG PO TABS
6.2500 mg | ORAL_TABLET | Freq: Two times a day (BID) | ORAL | Status: DC
  Administered 2023-01-16 – 2023-01-17 (×2): 6.25 mg via ORAL
  Filled 2023-01-16: qty 1
  Filled 2023-01-16: qty 2

## 2023-01-16 MED ORDER — HYDROMORPHONE HCL 1 MG/ML IJ SOLN
1.0000 mg | Freq: Once | INTRAMUSCULAR | Status: AC
  Administered 2023-01-16: 1 mg via INTRAVENOUS
  Filled 2023-01-16: qty 1

## 2023-01-16 MED ORDER — NITROGLYCERIN 0.4 MG SL SUBL: 0.4000 mg | SUBLINGUAL_TABLET | SUBLINGUAL | Status: DC | PRN

## 2023-01-16 MED ORDER — ATORVASTATIN CALCIUM 80 MG PO TABS
80.0000 mg | ORAL_TABLET | Freq: Every day | ORAL | Status: DC
  Administered 2023-01-16 – 2023-01-17 (×2): 80 mg via ORAL
  Filled 2023-01-16: qty 1
  Filled 2023-01-16: qty 2

## 2023-01-16 MED ORDER — ONDANSETRON HCL 4 MG/2ML IJ SOLN
4.0000 mg | Freq: Four times a day (QID) | INTRAMUSCULAR | Status: DC | PRN
  Administered 2023-01-17: 4 mg via INTRAVENOUS
  Filled 2023-01-16: qty 2

## 2023-01-16 MED ORDER — ASPIRIN 81 MG PO TBEC
81.0000 mg | DELAYED_RELEASE_TABLET | Freq: Every day | ORAL | Status: DC
  Administered 2023-01-16 – 2023-01-17 (×2): 81 mg via ORAL
  Filled 2023-01-16 (×2): qty 1

## 2023-01-16 MED ORDER — ASPIRIN 81 MG PO TBEC: 81.0000 mg | DELAYED_RELEASE_TABLET | Freq: Four times a day (QID) | ORAL | Status: DC | PRN

## 2023-01-16 MED ORDER — TICAGRELOR 90 MG PO TABS
90.0000 mg | ORAL_TABLET | Freq: Two times a day (BID) | ORAL | Status: DC
  Administered 2023-01-16 – 2023-01-17 (×2): 90 mg via ORAL
  Filled 2023-01-16 (×2): qty 1

## 2023-01-16 MED ORDER — IOHEXOL 350 MG/ML SOLN
100.0000 mL | Freq: Once | INTRAVENOUS | Status: AC | PRN
  Administered 2023-01-16: 100 mL via INTRAVENOUS

## 2023-01-16 MED ORDER — LOSARTAN POTASSIUM 50 MG PO TABS
50.0000 mg | ORAL_TABLET | Freq: Every day | ORAL | Status: DC
  Administered 2023-01-17: 50 mg via ORAL
  Filled 2023-01-16: qty 1

## 2023-01-16 MED ORDER — ENOXAPARIN SODIUM 40 MG/0.4ML IJ SOSY
40.0000 mg | PREFILLED_SYRINGE | INTRAMUSCULAR | Status: DC
  Administered 2023-01-16: 40 mg via SUBCUTANEOUS
  Filled 2023-01-16: qty 0.4

## 2023-01-16 MED ORDER — NITROGLYCERIN IN D5W 200-5 MCG/ML-% IV SOLN
0.0000 ug/min | INTRAVENOUS | Status: DC
  Administered 2023-01-16: 5 ug/min via INTRAVENOUS
  Filled 2023-01-16: qty 250

## 2023-01-16 MED ORDER — MORPHINE SULFATE (PF) 4 MG/ML IV SOLN
4.0000 mg | Freq: Once | INTRAVENOUS | Status: AC
  Administered 2023-01-16: 4 mg via INTRAVENOUS
  Filled 2023-01-16: qty 1

## 2023-01-16 MED ORDER — NITROGLYCERIN 0.4 MG SL SUBL
SUBLINGUAL_TABLET | SUBLINGUAL | Status: AC
  Filled 2023-01-16: qty 1

## 2023-01-16 MED ORDER — ACETAMINOPHEN 325 MG PO TABS
650.0000 mg | ORAL_TABLET | ORAL | Status: DC | PRN
  Administered 2023-01-16: 650 mg via ORAL
  Filled 2023-01-16: qty 2

## 2023-01-16 MED ORDER — TICAGRELOR 90 MG PO TABS: 90.0000 mg | ORAL_TABLET | Freq: Two times a day (BID) | ORAL | Status: DC

## 2023-01-16 MED ORDER — NITROGLYCERIN 0.4 MG SL SUBL
0.4000 mg | SUBLINGUAL_TABLET | SUBLINGUAL | Status: DC | PRN
  Administered 2023-01-16 (×3): 0.4 mg via SUBLINGUAL

## 2023-01-16 MED ORDER — TRAMADOL HCL 50 MG PO TABS
50.0000 mg | ORAL_TABLET | ORAL | Status: AC
  Administered 2023-01-17: 50 mg via ORAL
  Filled 2023-01-16: qty 1

## 2023-01-16 MED ORDER — ISOSORBIDE MONONITRATE ER 30 MG PO TB24
30.0000 mg | ORAL_TABLET | Freq: Every day | ORAL | Status: DC
  Administered 2023-01-16 – 2023-01-17 (×2): 30 mg via ORAL
  Filled 2023-01-16 (×2): qty 1

## 2023-01-16 NOTE — ED Notes (Signed)
Transport requested

## 2023-01-16 NOTE — ED Notes (Signed)
Hospitalist and Cardiologist on call notified of patient's current chest pain.

## 2023-01-16 NOTE — ED Provider Triage Note (Signed)
Emergency Medicine Provider Triage Evaluation Note  Kristopher Valdez , a 47 y.o. male  was evaluated in triage.  Pt complains of left sided chest pain and numbness and tingling in his left leg.  Patient had cath performed at [facility] on 01/04/23 with 1 stent placement.  He states his chest pain began last night and was relieved briefly with nitro, but returned.  His left leg numbness began this morning around 7-7:30 am after he woke up and started walking around.  He also has associated sweats and shortness of breath.  Denies fever.   Review of Systems  Positive: As above Negative: As above  Physical Exam  BP (!) 131/99 (BP Location: Right Arm)   Pulse 79   Temp 98.1 F (36.7 C)   Resp 16   SpO2 98%  Gen:   Awake, no distress   Resp:  Normal effort  MSK:   Moves extremities without difficulty  Other:  No facial droop or slurring of speech.  No pronator drift.  5/5 strength in bilateral upper and lower extremities.  Patient has weak grip chronically of right hand due to cerebral palsy.  Decreased sensation to touch in left lower extremity when compared to right.   Medical Decision Making  Medically screening exam initiated at 11:07 AM.  Appropriate orders placed.  Kristopher Valdez was informed that the remainder of the evaluation will be completed by another provider, this initial triage assessment does not replace that evaluation, and the importance of remaining in the ED until their evaluation is complete.     Melton Alar R, PA-C 01/16/23 1114

## 2023-01-16 NOTE — ED Provider Notes (Signed)
Clinical Course as of 01/16/23 1753  Fri Jan 16, 2023  1333 CXR wihtout acute chantges [JK]  1405 Patient's initial i-STAT shows potassium of greater than 8.5 however bicarb is normal and the creatinine is 0.9  .  Suspect hemolysis.  WIll repeat.  NO EKG Changes noted [JK]  1405 Repeat i-STAT shows a normal potassium.  Creatinine is normal. [JK]  1439 Disucssed with Dr Odis Hollingshead.  Continue current plan.   [JK]  1518 34 M with pmh CAD s/p stent 1-2 weeks prior in [place] presenting with CP today worsening. Started on nitro drip and IV pain meds. Getting CTA dissection scan. Dr Odis Hollingshead of cardiology aware, waiting on troponin. Was at 8700 on 01/05/23. [VB]  1533 Hs trop 6 today, reassuring EKGs x3. [VB]  1611 Personally reviewed patient's CTA dissection scan there was no evidence of dissection.  There was findings suggestive of left lower lobe aspiration.  He has normal white blood cell count 7.4 he is afebrile.  No left shift present. [VB]  1701 Patient seen by cardiology Dr. Odis Hollingshead.  Consult note in.  Recommending medicine admission for hypertensive urgency, repeat echocardiogram, trending of troponins. [VB]  1753 Patient discussed with Dr. Chipper Herb hospitalist for admission for hypertensive urgency and cardiology consult. [VB]    Clinical Course User Index [JK] Linwood Dibbles, MD [VB] Mardene Sayer, MD      Mardene Sayer, MD 01/16/23 [phone]

## 2023-01-16 NOTE — H&P (Signed)
History and Physical    Kristopher Valdez:[id number] DOB: 1976/04/09 DOA: 01/16/2023  PCP: Paulina Fusi, MD (Confirm with patient/family/NH records and if not entered, this has to be entered at [facility] point of entry) Patient coming from: Home  I have personally briefly reviewed patient's old medical records in [facility]  Chief Complaint: Chest pain  HPI: Kristopher Valdez is a 47 y.o. male with medical history significant of CAD, NSTEMI on 01/04/23 s/p LCx and LAD stenting on aspirin and Brilinta, HTN, HLD, remote history of seizure disorder, obesity, presented with chest pain.  Symptoms happened yesterday evening after eating dinner, patient started to have cramping-like chest pain initially on the left side of rib cage radiating to the retrosternal area then traveled to left shoulder and left arm arm, soon he will develop tingling sensation on the left 5 fingers, associated with shortness of breath, 6/10, the feature of the chest pain was similar to the chest pain she experienced when he had first MI 2 weeks ago.  He immediately took 1 nitroglycerin and symptoms gradually subsided around 9 PM. But at 11 PM, he started to have a second episode of chest pain similar features less intense 4-5/10, this time he did not take nitroglycerin and that the pain subsided by itself and he went to sleep.  He woke up this morning and started to feel chest pain remain the same and decided to come to ED.  He reported has been compliant with all his medications and check blood pressure every day with SBP 130-140, DBP lower 80s.  ED Course: Blood pressure elevated significantly with diastolic in the 100s.  None tachycardia not hypoxic.  EKG showed normal acute ST changes.  Chest x-ray no acute infiltrates.  Troponins negative x 2.  Cardiology evaluated patient and started patient on Imdur.  Review of Systems: As per HPI otherwise 14 point review of systems negative.    Past Medical History:  Diagnosis  Date   Arthritis    'all over" (02/22/13)   Cerebral palsy (HCC)    Chronic back pain greater than 3 months duration    Concussion ~ 02/22/06   "related to seizures; happened twice; don't know if LOC" (02-22-13)   Epilepsy (HCC)    "all but the 2 in ~ 02/22/2006 happen when I'm asleep" (02/22/2013)   GERD (gastroesophageal reflux disease)    takes tums prn   Hemorrhoid    Kidney stones    "passed on own" (02/22/2013)   Migraines    "monthly" (22-Feb-2013)   Narcolepsy    Neuromuscular disorder (HCC)    Cerebral Palsy   Pneumonia late 1990's?   Shortness of breath    with exertion   Stroke (HCC) ~ 02/23/2004   mild stroke, has short term memory loss    Past Surgical History:  Procedure Laterality Date   ANTERIOR CERVICAL DECOMP/DISCECTOMY FUSION  Feb 22, 2013   ANTERIOR CERVICAL DECOMP/DISCECTOMY FUSION N/A 22-Feb-2013   Procedure: ANTERIOR CERVICAL DECOMPRESSION/DISCECTOMY FUSION 1 LEVEL C4-5;  Surgeon: Venita Lick, MD;  Location: [facility] OR;  Service: Orthopedics;  Laterality: N/A;   APPENDECTOMY  ~ Feb 22, 2009   TENDON LENGTHENING Right ~ 1988   Arm and leg.   VASECTOMY  Feb 22, 2006     reports that he has never smoked. He quit smokeless tobacco use about 27 years ago.  His smokeless tobacco use included snuff and chew. He reports current alcohol use. He reports current drug use. Drug: Marijuana.  No Active Allergies  No  family history on file.   Prior to Admission medications   Medication Sig Start Date End Date Taking? Authorizing Provider  aspirin EC 81 MG tablet Take 81 mg by mouth every 6 (six) hours as needed. 07/17/21 02/07/23 Yes [provider]  atorvastatin (LIPITOR) 80 MG tablet Take 80 mg by mouth daily. 01/07/23 01/02/24 Yes [provider]  carvedilol (COREG) 6.25 MG tablet Take 6.25 mg by mouth 2 (two) times daily with a meal. 01/07/23 01/02/24 Yes [provider]  losartan (COZAAR) 50 MG tablet Take 50 mg by mouth daily. 01/08/23 01/03/24 Yes [provider]   nitroGLYCERIN (NITROSTAT) 0.4 MG SL tablet Place 0.4 mg under the tongue every 5 (five) minutes as needed for chest pain. 01/07/23 02/06/23 Yes [provider]  ticagrelor (BRILINTA) 90 MG TABS tablet Take 90 mg by mouth 2 (two) times daily. 01/07/23 01/02/24 Yes [provider]  divalproex (DEPAKOTE) 500 MG DR tablet Take 500 mg by mouth 3 (three) times daily. Patient not taking: Reported on 01/16/2023    [provider]  docusate sodium (COLACE) 100 MG capsule Take 1 capsule (100 mg total) by mouth 3 (three) times daily as needed for constipation. Patient not taking: Reported on 01/16/2023 01/26/13   Venita Lick, MD  methocarbamol (ROBAXIN) 500 MG tablet Take 1 tablet (500 mg total) by mouth 3 (three) times daily as needed. Patient not taking: Reported on 01/16/2023 01/26/13   Venita Lick, MD  ondansetron (ZOFRAN) 4 MG tablet Take 1 tablet (4 mg total) by mouth every 8 (eight) hours as needed for nausea. Patient not taking: Reported on 01/16/2023 01/26/13   Venita Lick, MD  oxyCODONE-acetaminophen (PERCOCET) 10-325 MG per tablet Take 1 tablet by mouth every 4 (four) hours as needed for pain. Patient not taking: Reported on 01/16/2023 01/26/13   Venita Lick, MD  polyethylene glycol powder [facility]) powder Take 17 g by mouth daily. Patient not taking: Reported on 01/16/2023 01/26/13   Venita Lick, MD    Physical Exam: Vitals:   01/16/23 1654 01/16/23 1700 01/16/23 1715 01/16/23 1730  BP:  (!) 138/99 (!) 136/102 (!) 132/92  Pulse: 61 61 60 69  Resp: 11 11 11 15   Temp:      TempSrc:      SpO2: 96% 93% 97% 96%  Weight:      Height:        Constitutional: NAD, calm, comfortable Vitals:   01/16/23 1654 01/16/23 1700 01/16/23 1715 01/16/23 1730  BP:  (!) 138/99 (!) 136/102 (!) 132/92  Pulse: 61 61 60 69  Resp: 11 11 11 15   Temp:      TempSrc:      SpO2: 96% 93% 97% 96%  Weight:      Height:       Eyes: PERRL, lids and conjunctivae normal ENMT: Mucous  membranes are moist. Posterior pharynx clear of any exudate or lesions.Normal dentition.  Neck: normal, supple, no masses, no thyromegaly Respiratory: clear to auscultation bilaterally, no wheezing, no crackles. Normal respiratory effort. No accessory muscle use.  Cardiovascular: Regular rate and rhythm, no murmurs / rubs / gallops. No extremity edema. 2+ pedal pulses. No carotid bruits.  Abdomen: no tenderness, no masses palpated. No hepatosplenomegaly. Bowel sounds positive.  Musculoskeletal: no clubbing / cyanosis. No joint deformity upper and lower extremities. Good ROM, no contractures. Normal muscle tone.  Skin: no rashes, lesions, ulcers. No induration Neurologic: CN 2-12 grossly intact. Sensation intact, DTR normal. Strength 5/5 in all 4.  Psychiatric:  Normal judgment and insight. Alert and oriented x 3. Normal mood.    Labs on Admission: I have personally reviewed following labs and imaging studies  CBC: Recent Labs  Lab 01/16/23 1108 01/16/23 1404  WBC 7.4  --   NEUTROABS 5.3  --   HGB 14.9 13.6  HCT 43.4 40.0  MCV 93.5  --   PLT 292  --    Basic Metabolic Panel: Recent Labs  Lab 01/16/23 1225 01/16/23 1404  NA 137 138  K 4.0 4.0  CL 105 104  CO2 23  --   GLUCOSE 105* 106*  BUN 11 11  CREATININE 0.85 0.70  CALCIUM 8.9  --    GFR: Estimated Creatinine Clearance: 148.7 mL/min (by C-G formula based on SCr of 0.7 mg/dL). Liver Function Tests: No results for input(s): "AST", "ALT", "ALKPHOS", "BILITOT", "PROT", "ALBUMIN" in the last 168 hours. No results for input(s): "LIPASE", "AMYLASE" in the last 168 hours. No results for input(s): "AMMONIA" in the last 168 hours. Coagulation Profile: No results for input(s): "INR", "PROTIME" in the last 168 hours. Cardiac Enzymes: No results for input(s): "CKTOTAL", "CKMB", "CKMBINDEX", "TROPONINI" in the last 168 hours. BNP (last 3 results) No results for input(s): "PROBNP" in the last 8760 hours. HbA1C: No results for  input(s): "HGBA1C" in the last 72 hours. CBG: No results for input(s): "GLUCAP" in the last 168 hours. Lipid Profile: No results for input(s): "CHOL", "HDL", "LDLCALC", "TRIG", "CHOLHDL", "LDLDIRECT" in the last 72 hours. Thyroid Function Tests: No results for input(s): "TSH", "T4TOTAL", "FREET4", "T3FREE", "THYROIDAB" in the last 72 hours. Anemia Panel: No results for input(s): "VITAMINB12", "FOLATE", "FERRITIN", "TIBC", "IRON", "RETICCTPCT" in the last 72 hours. Urine analysis: No results found for: "COLORURINE", "APPEARANCEUR", "LABSPEC", "PHURINE", "GLUCOSEU", "HGBUR", "BILIRUBINUR", "KETONESUR", "PROTEINUR", "UROBILINOGEN", "NITRITE", "LEUKOCYTESUR"  Radiological Exams on Admission: CT Angio Chest/Abd/Pel for Dissection W and/or Wo Contrast  Result Date: 01/16/2023 CLINICAL DATA:  Acute aortic syndrome suspected. EXAM: CT ANGIOGRAPHY CHEST, ABDOMEN AND PELVIS TECHNIQUE: Non-contrast CT of the chest was initially obtained. Multidetector CT imaging through the chest, abdomen and pelvis was performed using the standard protocol during bolus administration of intravenous contrast. Multiplanar reconstructed images and MIPs were obtained and reviewed to evaluate the vascular anatomy. RADIATION DOSE REDUCTION: This exam was performed according to the departmental dose-optimization program which includes automated exposure control, adjustment of the mA and/or kV according to patient size and/or use of iterative reconstruction technique. CONTRAST:  OMNIPAQUE IOHEXOL 350 MG/ML SOLN COMPARISON:  CT abdomen pelvis dated 04/23/2022. FINDINGS: CTA CHEST FINDINGS Cardiovascular: There is no cardiomegaly or pericardial effusion. There is coronary vascular calcification. The thoracic aorta is unremarkable. The origins of the great vessels of the aortic arch appear patent as visualized. No pulmonary artery embolus identified. Mediastinum/Nodes: No hilar or mediastinal adenopathy. Small hiatal hernia. The  esophagus is grossly unremarkable. No mediastinal fluid collection. Lungs/Pleura: Focal subpleural scarring at the left lung base, present on the prior CT. There is impaction of a left lower lobe bronchus with linear and nodular peribronchial densities. Findings may represent mucous impaction, or aspiration. An endobronchial lesion is not excluded. Further evaluation with bronchoscopy or follow-up with CT recommended6 to document resolution. The right lung is clear. There is no pleural effusion or pneumothorax. The central airways remain patent. Musculoskeletal: No acute osseous pathology. Mild chronic appearing compression fracture of superior endplate of T8, T11, and T12. Review of the MIP images confirms the above findings. CTA ABDOMEN AND PELVIS FINDINGS VASCULAR Aorta: Normal caliber aorta  without aneurysm, dissection, vasculitis or significant stenosis. Celiac: Patent without evidence of aneurysm, dissection, vasculitis or significant stenosis. SMA: Patent without evidence of aneurysm, dissection, vasculitis or significant stenosis. Renals: Both renal arteries are patent without evidence of aneurysm, dissection, vasculitis, fibromuscular dysplasia or significant stenosis. IMA: Patent without evidence of aneurysm, dissection, vasculitis or significant stenosis. Inflow: Patent without evidence of aneurysm, dissection, vasculitis or significant stenosis. Veins: No obvious venous abnormality within the limitations of this arterial phase study. Review of the MIP images confirms the above findings. NON-VASCULAR No intra-abdominal free air or free fluid. Hepatobiliary: The liver is unremarkable. No biliary dilatation. Cholecystectomy. Pancreas: Unremarkable. No pancreatic ductal dilatation or surrounding inflammatory changes. Spleen: Normal in size without focal abnormality. Adrenals/Urinary Tract: The adrenal glands unremarkable. There is no hydronephrosis on either side. The visualized ureters and urinary bladder  appear unremarkable. Stomach/Bowel: There is no bowel obstruction or active inflammation. Appendectomy. Lymphatic: No adenopathy. Reproductive: Similar vesicles are grossly unremarkable. Other: Small fat containing umbilical hernia. Musculoskeletal: No acute or significant osseous findings. Review of the MIP images confirms the above findings. IMPRESSION: 1. No aortic aneurysm or dissection. 2. Impacted left lower lobe bronchus with linear and nodular peribronchial densities. Findings may represent mucous impaction, or aspiration. An endobronchial lesion is not excluded. Further evaluation with bronchoscopy or follow-up with CT recommended to document resolution. 3. No acute intra-abdominal or pelvic pathology. Electronically Signed   By: Elgie Collard M.D.   On: 01/16/2023 16:05   DG Chest 2 View  Result Date: 01/16/2023 CLINICAL DATA:  Chest pain with left-sided numbness. EXAM: CHEST - 2 VIEW COMPARISON:  Radiographs 05/30/2020 and 02/02/2017.  CT 06/25/2015. FINDINGS: The heart size and mediastinal contours are stable. The lungs are clear. There is no pleural effusion or pneumothorax. No acute osseous findings are identified. Stable minimal anterior wedging of a midthoracic vertebral body. There are postsurgical changes in the lower cervical spine. IMPRESSION: Stable chest. No active cardiopulmonary process. Electronically Signed   By: Carey Bullocks M.D.   On: 01/16/2023 11:54    EKG: Independently reviewed.  Sinus, no acute ST changes.  Assessment/Plan Principal Problem:   Chest pain Active Problems:   Precordial pain   Atherosclerotic heart disease   S/P primary angioplasty with coronary stent   Hx of non-ST elevation myocardial infarction (NSTEMI)   Benign hypertension   Mixed hyperlipidemia   Cerebral palsy  (please populate well all problems here in Problem List. (For example, if patient is on BP meds at home and you resume or decide to hold them, it is a problem that needs to be her.  Same for CAD, COPD, HLD and so on)  Anginal-like chest pain -Clinically suspect coronary artery spasm -Agreed with Imdur -Requires tighter control of blood pressure -ACS rule out.  Continue current dual antiplatelet regimen aspirin and Brilinta, beta-blocker and ACEI and statin. -Echo as per cardiology -Other Ddx B/L pulses equal, low suspicion for dissection   HTN, uncontrolled -Start Imdur -Continue Coreg and losartan  CAD -As above  Obesity -BMI=38 -Calorie control recommended -Outpatient sleep study to rule out OSA  DVT prophylaxis: Lovenox Code Status: Full code Family Communication: None at bedside Disposition Plan: Expect less than 2 midnight hospital stay Consults called: Cardiology Admission status: Tele obs   Emeline General MD [facility] Pager [phone]  01/16/2023, 6:22 PM

## 2023-01-16 NOTE — ED Notes (Signed)
Patient complaining of chest pan 10/10 described as pressure. PRN tylenol ordered and given. EKG captured and will notify MD.

## 2023-01-16 NOTE — ED Provider Notes (Signed)
[place] EMERGENCY DEPARTMENT AT [facility] Provider Note   CSN: [id number] Arrival date & time: 01/16/23  1043     History {Add pertinent medical, surgical, social history, OB history to HPI:1} Chief Complaint  Patient presents with  . Chest Pain  . Weakness  . Numbness    Kristopher Valdez is a 47 y.o. male.   Chest Pain Associated symptoms: weakness   Weakness Associated symptoms: chest pain   Pt started having chest pain last night.  He was recently in the hospital in [place] s/p MI this month with stents.   Pt started having pain last night.  Sx very similar.  Left  chest radiating to arm. patient who feels like her arm is numb.  Earlier he felt like the nitroglycerin helped but the symptoms have returned.     Home Medications Prior to Admission medications   Medication Sig Start Date End Date Taking? Authorizing Provider  divalproex (DEPAKOTE) 500 MG DR tablet Take 500 mg by mouth 3 (three) times daily.    [provider]  docusate sodium (COLACE) 100 MG capsule Take 1 capsule (100 mg total) by mouth 3 (three) times daily as needed for constipation. 01/26/13   Venita Lick, MD  methocarbamol (ROBAXIN) 500 MG tablet Take 1 tablet (500 mg total) by mouth 3 (three) times daily as needed. 01/26/13   Venita Lick, MD  ondansetron (ZOFRAN) 4 MG tablet Take 1 tablet (4 mg total) by mouth every 8 (eight) hours as needed for nausea. 01/26/13   Venita Lick, MD  oxyCODONE-acetaminophen (PERCOCET) 10-325 MG per tablet Take 1 tablet by mouth every 4 (four) hours as needed for pain. 01/26/13   Venita Lick, MD  polyethylene glycol powder (GLYCOLAX) powder Take 17 g by mouth daily. 01/26/13   Venita Lick, MD      Allergies    Naproxen    Review of Systems   Review of Systems  Cardiovascular:  Positive for chest pain.  Neurological:  Positive for weakness.    Physical Exam Updated Vital Signs BP (!) 131/99 (BP Location: Right Arm)   Pulse 79    Temp 98.1 F (36.7 C)   Resp 16   Ht 1.753 m ( )   Wt 117 kg   SpO2 40%   BMI 38.10 kg/m  Physical Exam Vitals and nursing note reviewed.  Constitutional:      Appearance: He is well-developed. He is ill-appearing.  HENT:     Head: Normocephalic and atraumatic.     Right Ear: External ear normal.     Left Ear: External ear normal.  Eyes:     General: No scleral icterus.       Right eye: No discharge.        Left eye: No discharge.     Conjunctiva/sclera: Conjunctivae normal.  Neck:     Trachea: No tracheal deviation.  Cardiovascular:     Rate and Rhythm: Normal rate and regular rhythm.  Pulmonary:     Effort: Pulmonary effort is normal. No respiratory distress.     Breath sounds: Normal breath sounds. No stridor. No wheezing or rales.  Abdominal:     General: Bowel sounds are normal. There is no distension.     Palpations: Abdomen is soft.     Tenderness: There is no abdominal tenderness. There is no guarding or rebound.  Musculoskeletal:        General: No tenderness or deformity.     Cervical back: Neck supple.  Right lower leg: No edema.     Left lower leg: No edema.  Skin:    General: Skin is warm and dry.     Findings: No rash.  Neurological:     General: No focal deficit present.     Mental Status: He is alert.     Cranial Nerves: No cranial nerve deficit, dysarthria or facial asymmetry.     Sensory: No sensory deficit.     Motor: No abnormal muscle tone or seizure activity.     Coordination: Coordination normal.  Psychiatric:        Mood and Affect: Mood normal.     ED Results / Procedures / Treatments   Labs (all labs ordered are listed, but only abnormal results are displayed) Labs Reviewed  CBC WITH DIFFERENTIAL/PLATELET  BASIC METABOLIC PANEL  I-STAT CHEM 8, ED  TROPONIN I (HIGH SENSITIVITY)  TROPONIN I (HIGH SENSITIVITY)    EKG EKG Interpretation  Date/Time:  Friday January 16 2023 10:58:40 EDT Ventricular Rate:  83 PR  Interval:  148 QRS Duration: 92 QT Interval:  378 QTC Calculation: 444 R Axis:   -1 Text Interpretation: Normal sinus rhythm Minimal voltage criteria for LVH, may be normal variant ( R in aVL ) Borderline ECG When compared with ECG of 26-Jan-2013 09:34, No significant change since last tracing Confirmed by Linwood Dibbles [phone]) on 01/16/2023 12:37:48 PM  Radiology DG Chest 2 View  Result Date: 01/16/2023 CLINICAL DATA:  Chest pain with left-sided numbness. EXAM: CHEST - 2 VIEW COMPARISON:  Radiographs 05/30/2020 and 02/02/2017.  CT 06/25/2015. FINDINGS: The heart size and mediastinal contours are stable. The lungs are clear. There is no pleural effusion or pneumothorax. No acute osseous findings are identified. Stable minimal anterior wedging of a midthoracic vertebral body. There are postsurgical changes in the lower cervical spine. IMPRESSION: Stable chest. No active cardiopulmonary process. Electronically Signed   By: Carey Bullocks M.D.   On: 01/16/2023 11:54    Procedures Procedures  {Document cardiac monitor, telemetry assessment procedure when appropriate:1}  Medications Ordered in ED Medications - No data to display  ED Course/ Medical Decision Making/ A&P Clinical Course as of 01/16/23 1411  Fri Jan 16, 2023  1333 CXR wihtout acute chantges [JK]  1405 Patient's initial i-STAT shows potassium of greater than 8.5 however bicarb is normal and the creatinine is 0.9  .  Suspect hemolysis.  WIll repeat.  NO EKG Changes noted [JK]  1405 Repeat i-STAT shows a normal potassium.  Creatinine is normal. [JK]    Clinical Course User Index [JK] Linwood Dibbles, MD   {   Click here for ABCD2, HEART and other calculatorsREFRESH Note before signing :1}                          Medical Decision Making Differential diagnosis includes but not limited to acute coronary syndrome, Dressler syndrome, aortic dissection  Risk Prescription drug management.   ***  {Document critical care time when  appropriate:1} {Document review of labs and clinical decision tools ie heart score, Chads2Vasc2 etc:1}  {Document your independent review of radiology images, and any outside records:1} {Document your discussion with family members, caretakers, and with consultants:1} {Document social determinants of health affecting pt's care:1} {Document your decision making why or why not admission, treatments were needed:1} Final Clinical Impression(s) / ED Diagnoses Final diagnoses:  None    Rx / DC Orders ED Discharge Orders     None

## 2023-01-16 NOTE — ED Triage Notes (Signed)
Pt. Stated, I started having some chest pain and left side numbness on left side

## 2023-01-16 NOTE — ED Notes (Signed)
ED TO INPATIENT HANDOFF REPORT  ED Nurse Name and Phone #: [phone]  S Name/Age/Gender Kristopher Valdez 47 y.o. male Room/Bed: 031C/031C  Code Status   Code Status: Full Code  Home/SNF/Other Home Patient oriented to: self, place, time, and situation Is this baseline? Yes   Triage Complete: Triage complete  Chief Complaint Chest pain [R07.9]  Triage Note Pt. Stated, I started having some chest pain and left side numbness on left side    Allergies No Active Allergies  Level of Care/Admitting Diagnosis ED Disposition     ED Disposition  Admit   Condition  --   Comment  Hospital Area: [facility] [[id number]]  Level of Care: Telemetry Cardiac [[id number]]  May place patient in observation at [facility] or [facility] if equivalent level of care is available:: No  Covid Evaluation: Asymptomatic - no recent exposure (last 10 days) testing not required  Diagnosis: Chest pain [454098]  Admitting Physician: Emeline General [[id number]]  Attending Physician: Emeline General [[id number]]          B Medical/Surgery History Past Medical History:  Diagnosis Date   Arthritis    'all over" (02-08-13)   Cerebral palsy (HCC)    Chronic back pain greater than 3 months duration    Concussion ~ 2007   "related to seizures; happened twice; don't know if LOC" (Feb 08, 2013)   Epilepsy (HCC)    "all but the 2 in ~ 2007 happen when I'm asleep" (02-08-13)   GERD (gastroesophageal reflux disease)    takes tums prn   Hemorrhoid    Kidney stones    "passed on own" (08-Feb-2013)   Migraines    "monthly" (08-Feb-2013)   Narcolepsy    Neuromuscular disorder (HCC)    Cerebral Palsy   Pneumonia late 1990's?   Shortness of breath    with exertion   Stroke (HCC) ~ 2005   mild stroke, has short term memory loss   Past Surgical History:  Procedure Laterality Date   ANTERIOR CERVICAL DECOMP/DISCECTOMY FUSION  02-08-2013   ANTERIOR CERVICAL DECOMP/DISCECTOMY FUSION N/A 02/08/2013    Procedure: ANTERIOR CERVICAL DECOMPRESSION/DISCECTOMY FUSION 1 LEVEL C4-5;  Surgeon: Venita Lick, MD;  Location: [facility] OR;  Service: Orthopedics;  Laterality: N/A;   APPENDECTOMY  ~ 2010   TENDON LENGTHENING Right ~ 1988   Arm and leg.   VASECTOMY  2007     A IV Location/Drains/Wounds Patient Lines/Drains/Airways Status     Active Line/Drains/Airways     Name Placement date Placement time Site Days   Peripheral IV 01/16/23 20 G Posterior;Proximal;Right Forearm 01/16/23  --  Forearm  less than 1   Incision 02/08/2013 Neck Other (Comment) 02/08/2013  1222  -- 3642            Intake/Output Last 24 hours No intake or output data in the 24 hours ending 01/16/23 1909  Labs/Imaging Results for orders placed or performed during the hospital encounter of 01/16/23 (from the past 48 hour(s))  CBC with Differential     Status: None   Collection Time: 01/16/23 11:08 AM  Result Value Ref Range   WBC 7.4 4.0 - 10.5 K/uL   RBC 4.64 4.22 - 5.81 MIL/uL   Hemoglobin 14.9 13.0 - 17.0 g/dL   HCT 08.6 57.8 - 46.9 %   MCV 93.5 80.0 - 100.0 fL   MCH 32.1 26.0 - 34.0 pg   MCHC 34.3 30.0 - 36.0 g/dL   RDW 62.9 52.8 - 41.3 %  Platelets 292 150 - 400 K/uL   nRBC 0.0 0.0 - 0.2 %   Neutrophils Relative % 71 %   Neutro Abs 5.3 1.7 - 7.7 K/uL   Lymphocytes Relative 21 %   Lymphs Abs 1.5 0.7 - 4.0 K/uL   Monocytes Relative 5 %   Monocytes Absolute 0.4 0.1 - 1.0 K/uL   Eosinophils Relative 2 %   Eosinophils Absolute 0.1 0.0 - 0.5 K/uL   Basophils Relative 1 %   Basophils Absolute 0.0 0.0 - 0.1 K/uL   Immature Granulocytes 0 %   Abs Immature Granulocytes 0.01 0.00 - 0.07 K/uL    Comment: Performed at [facility], [address], [place], [place] [address]  Basic metabolic panel     Status: Abnormal   Collection Time: 01/16/23 12:25 PM  Result Value Ref Range   Sodium 137 135 - 145 mmol/L   Potassium 4.0 3.5 - 5.1 mmol/L   Chloride 105 98 - 111 mmol/L   CO2 23 22 - 32 mmol/L   Glucose,  Bld 105 (H) 70 - 99 mg/dL    Comment: Glucose reference range applies only to samples taken after fasting for at least 8 hours.   BUN 11 6 - 20 mg/dL   Creatinine, Ser 6.04 0.61 - 1.24 mg/dL   Calcium 8.9 8.9 - 54.0 mg/dL   GFR, Estimated >98 >11 mL/min    Comment: (NOTE) Calculated using the CKD-EPI Creatinine Equation (2021)    Anion gap 9 5 - 15    Comment: Performed at [facility], [address], [place], [place] [address]  Troponin I (High Sensitivity)     Status: None   Collection Time: 01/16/23 12:25 PM  Result Value Ref Range   Troponin I (High Sensitivity) 6 <18 ng/L    Comment: (NOTE) Elevated high sensitivity troponin I (hsTnI) values and significant  changes across serial measurements may suggest ACS but many other  chronic and acute conditions are known to elevate hsTnI results.  Refer to the "Links" section for chest pain algorithms and additional  guidance. Performed at [facility], [address] [place], [place] [address]   I-stat chem 8, ED (not at [facility], [facility] or [facility])     Status: Abnormal   Collection Time: 01/16/23  2:04 PM  Result Value Ref Range   Sodium 138 135 - 145 mmol/L   Potassium 4.0 3.5 - 5.1 mmol/L   Chloride 104 98 - 111 mmol/L   BUN 11 6 - 20 mg/dL   Creatinine, Ser 1.30 0.61 - 1.24 mg/dL   Glucose, Bld 865 (H) 70 - 99 mg/dL    Comment: Glucose reference range applies only to samples taken after fasting for at least 8 hours.   Calcium, Ion 1.22 1.15 - 1.40 mmol/L   TCO2 24 22 - 32 mmol/L   Hemoglobin 13.6 13.0 - 17.0 g/dL   HCT 78.4 69.6 - 29.5 %  Rapid urine drug screen (hospital performed)     Status: Abnormal   Collection Time: 01/16/23  4:16 PM  Result Value Ref Range   Opiates POSITIVE (A) NONE DETECTED   Cocaine NONE DETECTED NONE DETECTED   Benzodiazepines NONE DETECTED NONE DETECTED   Amphetamines NONE DETECTED NONE DETECTED   Tetrahydrocannabinol NONE DETECTED NONE DETECTED   Barbiturates NONE DETECTED NONE DETECTED     Comment: (NOTE) DRUG SCREEN FOR MEDICAL PURPOSES ONLY.  IF CONFIRMATION IS NEEDED FOR ANY PURPOSE, NOTIFY LAB WITHIN 5 DAYS.  LOWEST DETECTABLE LIMITS FOR URINE DRUG SCREEN Drug Class                     Cutoff (ng/mL) Amphetamine and metabolites    1000 Barbiturate and metabolites    200 Benzodiazepine                 200 Opiates and metabolites        300 Cocaine and metabolites        300 THC                            50 Performed at [facility], [address] [place], [place] [address]    CT Angio Chest/Abd/Pel for Dissection W and/or Wo Contrast  Result Date: 01/16/2023 CLINICAL DATA:  Acute aortic syndrome suspected. EXAM: CT ANGIOGRAPHY CHEST, ABDOMEN AND PELVIS TECHNIQUE: Non-contrast CT of the chest was initially obtained. Multidetector CT imaging through the chest, abdomen and pelvis was performed using the standard protocol during bolus administration of intravenous contrast. Multiplanar reconstructed images and MIPs were obtained and reviewed to evaluate the vascular anatomy. RADIATION DOSE REDUCTION: This exam was performed according to the departmental dose-optimization program which includes automated exposure control, adjustment of the mA and/or kV according to patient size and/or use of iterative reconstruction technique. CONTRAST:  OMNIPAQUE IOHEXOL 350 MG/ML SOLN COMPARISON:  CT abdomen pelvis dated 04/23/2022. FINDINGS: CTA CHEST FINDINGS Cardiovascular: There is no cardiomegaly or pericardial effusion. There is coronary vascular calcification. The thoracic aorta is unremarkable. The origins of the great vessels of the aortic arch appear patent as visualized. No pulmonary artery embolus identified. Mediastinum/Nodes: No hilar or mediastinal adenopathy. Small hiatal hernia. The esophagus is grossly unremarkable. No mediastinal fluid collection. Lungs/Pleura: Focal subpleural scarring at the left lung base, present on the prior CT. There is impaction of a  left lower lobe bronchus with linear and nodular peribronchial densities. Findings may represent mucous impaction, or aspiration. An endobronchial lesion is not excluded. Further evaluation with bronchoscopy or follow-up with CT recommended6 to document resolution. The right lung is clear. There is no pleural effusion or pneumothorax. The central airways remain patent. Musculoskeletal: No acute osseous pathology. Mild chronic appearing compression fracture of superior endplate of T8, T11, and T12. Review of the MIP images confirms the above findings. CTA ABDOMEN AND PELVIS FINDINGS VASCULAR Aorta: Normal caliber aorta without aneurysm, dissection, vasculitis or significant stenosis. Celiac: Patent without evidence of aneurysm, dissection, vasculitis or significant stenosis. SMA: Patent without evidence of aneurysm, dissection, vasculitis or significant stenosis. Renals: Both renal arteries are patent without evidence of aneurysm, dissection, vasculitis, fibromuscular dysplasia or significant stenosis. IMA: Patent without evidence of aneurysm, dissection, vasculitis or significant stenosis. Inflow: Patent without evidence of aneurysm, dissection, vasculitis or significant stenosis. Veins: No obvious venous abnormality within the limitations of this arterial phase study. Review of the MIP images confirms the above findings. NON-VASCULAR No intra-abdominal free air or free fluid. Hepatobiliary: The liver is unremarkable. No biliary dilatation. Cholecystectomy. Pancreas: Unremarkable. No pancreatic ductal dilatation or surrounding inflammatory changes. Spleen: Normal in size without focal abnormality. Adrenals/Urinary Tract: The adrenal glands unremarkable. There is no hydronephrosis on either side. The visualized ureters and urinary bladder appear unremarkable. Stomach/Bowel: There is no bowel obstruction or active inflammation. Appendectomy. Lymphatic: No adenopathy. Reproductive: Similar vesicles are grossly  unremarkable. Other: Small fat containing umbilical hernia. Musculoskeletal: No acute or significant osseous findings. Review of the MIP  images confirms the above findings. IMPRESSION: 1. No aortic aneurysm or dissection. 2. Impacted left lower lobe bronchus with linear and nodular peribronchial densities. Findings may represent mucous impaction, or aspiration. An endobronchial lesion is not excluded. Further evaluation with bronchoscopy or follow-up with CT recommended to document resolution. 3. No acute intra-abdominal or pelvic pathology. Electronically Signed   By: Elgie Collard M.D.   On: 01/16/2023 16:05   DG Chest 2 View  Result Date: 01/16/2023 CLINICAL DATA:  Chest pain with left-sided numbness. EXAM: CHEST - 2 VIEW COMPARISON:  Radiographs 05/30/2020 and 02/02/2017.  CT 06/25/2015. FINDINGS: The heart size and mediastinal contours are stable. The lungs are clear. There is no pleural effusion or pneumothorax. No acute osseous findings are identified. Stable minimal anterior wedging of a midthoracic vertebral body. There are postsurgical changes in the lower cervical spine. IMPRESSION: Stable chest. No active cardiopulmonary process. Electronically Signed   By: Carey Bullocks M.D.   On: 01/16/2023 11:54    Pending Labs Unresulted Labs (From admission, onward)     Start     Ordered   01/16/23 1743  HIV Antibody (routine testing w rflx)  (HIV Antibody (Routine testing w reflex) panel)  Once,   R        01/16/23 1744   01/16/23 1702  Brain natriuretic peptide  Once,   URGENT        01/16/23 1701   01/16/23 1627  Brain natriuretic peptide  Once,   URGENT        01/16/23 1626            Vitals/Pain Today's Vitals   01/16/23 1654 01/16/23 1700 01/16/23 1715 01/16/23 1730  BP:  (!) 138/99 (!) 136/102 (!) 132/92  Pulse: 61 61 60 69  Resp: 11 11 11 15   Temp:      TempSrc:      SpO2: 96% 93% 97% 96%  Weight:      Height:      PainSc:        Isolation Precautions No active  isolations  Medications Medications  aspirin EC tablet 81 mg (81 mg Oral Given 01/16/23 1753)  ticagrelor (BRILINTA) tablet 90 mg (90 mg Oral Given 01/16/23 1753)  isosorbide mononitrate (IMDUR) 24 hr tablet 30 mg (30 mg Oral Given 01/16/23 1754)  atorvastatin (LIPITOR) tablet 80 mg (80 mg Oral Given 01/16/23 1753)  carvedilol (COREG) tablet 6.25 mg (6.25 mg Oral Given 01/16/23 1753)  losartan (COZAAR) tablet 50 mg (has no administration in time range)  acetaminophen (TYLENOL) tablet 650 mg (has no administration in time range)  ondansetron (ZOFRAN) injection 4 mg (has no administration in time range)  enoxaparin (LOVENOX) injection 40 mg (has no administration in time range)  morphine (PF) 4 MG/ML injection 4 mg (4 mg Intravenous Given 01/16/23 1401)  HYDROmorphone (DILAUDID) injection 1 mg (1 mg Intravenous Given 01/16/23 1427)  iohexol (OMNIPAQUE) 350 MG/ML injection 100 mL (100 mLs Intravenous Contrast Given 01/16/23 1546)    Mobility walks     Focused Assessments    R Recommendations: See Admitting Provider Note  Report given to:   Additional Notes:

## 2023-01-16 NOTE — Progress Notes (Addendum)
Renae Fickle RN called to inform the patient was having CP and EKG was done.   Tylenol given and pain resolved.   EKG notes SR w/o acute changes.   Delilah Shan, [facility]  Pager:  [phone] Office: [phone]  Addendum:  RN called regarding him voicing chest pain.  EKG from 23:14 reviewed - noted NSR without ST elevation or ischemic changes.  He was given one sublingual nitro and some improvement in symptoms.  Repeat hstrop was 5.  No recommendations for now.   Tessa Lerner, [place], [facility]  Pager:  [phone] Office: [phone]

## 2023-01-16 NOTE — Consult Note (Signed)
CARDIOLOGY CONSULT NOTE  Patient ID: Kristopher Valdez MRN: [id number] DOB/AGE: 1976-02-03 47 y.o.  Admit date: 01/16/2023 Attending physician: Mardene Sayer, MD Primary Physician:  Paulina Fusi, MD Outpatient Cardiology Provider: NA Inpatient Cardiologist: Tessa Lerner, DO, [facility]  Reason of consultation: Chest pain Referring physician: Dr.John Lynelle Doctor  Chief complaint: Chest pain  HPI:  Kristopher Valdez is a 47 y.o. Caucasian male who presents with a chief complaint of " chest pain." His past medical history and cardiovascular risk factors include: Cerebral palsy, hypertension, hyperlipidemia, recent NSTEMI status post PCI x 2 (S/p PCI/DES to LCx 01/04/23 & Staged PCI/DES to D1 01/06/23) , established CAD.  In April 2024 while visiting [facility] who presented to [facility] [facility] for evaluation of chest pain.  Based on electronic medical records his high sensitive troponins were elevated, he underwent angiography and was noted to have occluded circumflex and 70% proximal diagonal lesion.  He initially went intervention to the LCx and staged intervention to the diagonal lesion couple days later.  After the staged intervention there was concerns for focal neurological deficits and code stroke was called.  After being evaluated neurology recommended no interventions.  He was discharged home on antihypertensive medications, statins, dual antiplatelet therapy.  He is doing well and compliant with medical therapy.  He has not establish care with cardiology locally and presents to the hospital with a chief complaint of chest pain.  The symptoms started yesterday 01/15/2023 approximately 6 PM.  He describes it as tingling/numbness in the left arm, anterior chest wall, radiating to the neck, pressure, feel like a hammer slamming on the chest, constant, not brought on by activity, does not resolve with rest.  He did take 1 tablet of sublingual nitroglycerin 9 PM yesterday which helped his  symptoms until 11 PM.  He did not seek medical attention yesterday night and went to sleep.  No issues overnight but this morning when he woke up he had similar symptoms and came to the ED.  His current symptoms are similar to what he was experiencing in April 2024; with the exception of his current symptoms are affecting the left arm and his indexed presentation involves the right arm.  Initial vital signs 131/91, respiratory rate 16 breaths/min, 79 bpm, 98% on room air and 98% temperature.  Blood pressures as high as 160/114.  Currently on nitro drip.  Symptoms are improving.  Currently admitted under medicine and cardiology is asked to consult.  ALLERGIES: No Active Allergies  PAST MEDICAL HISTORY: Past Medical History:  Diagnosis Date   Arthritis    'all over" (02-06-2013)   Cerebral palsy (HCC)    Chronic back pain greater than 3 months duration    Concussion ~ 2007   "related to seizures; happened twice; don't know if LOC" (Feb 06, 2013)   Epilepsy (HCC)    "all but the 2 in ~ 2007 happen when I'm asleep" (02-06-2013)   GERD (gastroesophageal reflux disease)    takes tums prn   Hemorrhoid    Kidney stones    "passed on own" (Feb 06, 2013)   Migraines    "monthly" (02/06/13)   Narcolepsy    Neuromuscular disorder (HCC)    Cerebral Palsy   Pneumonia late 1990's?   Shortness of breath    with exertion   Stroke (HCC) ~ 2005   mild stroke, has short term memory loss    PAST SURGICAL HISTORY: Past Surgical History:  Procedure Laterality Date   ANTERIOR CERVICAL DECOMP/DISCECTOMY FUSION  Feb 06, 2013  ANTERIOR CERVICAL DECOMP/DISCECTOMY FUSION N/A 01/26/2013   Procedure: ANTERIOR CERVICAL DECOMPRESSION/DISCECTOMY FUSION 1 LEVEL C4-5;  Surgeon: Venita Lick, MD;  Location: [facility] OR;  Service: Orthopedics;  Laterality: N/A;   APPENDECTOMY  ~ 2010   TENDON LENGTHENING Right ~ 1988   Arm and leg.   VASECTOMY  2007    FAMILY HISTORY: No family history of premature coronary artery  disease or sudden cardiac death. Father is deceased. Mother still alive with COPD, fibromyalgia, diabetes   SOCIAL HISTORY:  The patient  reports that he has never smoked. He quit smokeless tobacco use about 27 years ago.  His smokeless tobacco use included snuff and chew. He reports current alcohol use. He reports current drug use. Drug: Marijuana.  MEDICATIONS: Current Outpatient Medications  Medication Instructions   aspirin EC 81 mg, Oral, Every 6 hours PRN   atorvastatin (LIPITOR) 80 mg, Oral, Daily   carvedilol (COREG) 6.25 mg, Oral, 2 times daily with meals   divalproex (DEPAKOTE) 500 mg, 3 times daily   docusate sodium (COLACE) 100 mg, Oral, 3 times daily PRN   losartan (COZAAR) 50 mg, Oral, Daily   methocarbamol (ROBAXIN) 500 mg, Oral, 3 times daily PRN   nitroGLYCERIN (NITROSTAT) 0.4 mg, Sublingual, Every 5 min PRN   ondansetron (ZOFRAN) 4 mg, Oral, Every 8 hours PRN   oxyCODONE-acetaminophen (PERCOCET) 10-325 MG per tablet 1 tablet, Oral, Every 4 hours PRN   polyethylene glycol powder (GLYCOLAX) 17 g, Oral, Daily   ticagrelor (BRILINTA) 90 mg, Oral, 2 times daily    REVIEW OF SYSTEMS: Review of Systems  HENT:         Slowness in enunciating speech  Cardiovascular:  Positive for chest pain. Negative for claudication, dyspnea on exertion, irregular heartbeat, leg swelling, near-syncope, orthopnea, palpitations, paroxysmal nocturnal dyspnea and syncope.  Respiratory:  Positive for shortness of breath.   Hematologic/Lymphatic: Negative for bleeding problem.  Musculoskeletal:  Negative for muscle cramps and myalgias.  Neurological:  Positive for numbness and paresthesias. Negative for dizziness and light-headedness.    PHYSICAL EXAMINATION: PHYSICAL EXAM: Temp:  [97.9 F (36.6 C)-98.1 F (36.7 C)] 97.9 F (36.6 C) (04/19 1608) Pulse Rate:  [75-81] 76 (04/19 1608) Resp:  [12-20] 20 (04/19 1608) BP: (131-160)/(94-114) 137/107 (04/19 1608) SpO2:  [96 %-99 %] 96 % (04/19  1608) Weight:  [117 kg] 117 kg (04/19 1108)  Intake/Output: No intake or output data in the 24 hours ending 01/16/23 1629   Net IO Since Admission: No IO data has been entered for this period [01/16/23 1629]  Weights:     01/16/2023   11:08 AM 01/21/2013    9:59 AM  Last 3 Weights  Weight (lbs) 258 lb 229 lb 3 oz  Weight (kg) 117.028 kg 103.959 kg     Physical Exam  Constitutional: No distress.  Appears older than stated age, hemodynamically stable.   Neck: No JVD present.  Cardiovascular: Normal rate, regular rhythm, S1 normal, S2 normal, intact distal pulses and normal pulses. Exam reveals no gallop, no S3 and no S4.  No murmur heard. Pulmonary/Chest: Effort normal and breath sounds normal. No stridor. He has no wheezes. He has no rales.  Abdominal: Soft. Bowel sounds are normal. He exhibits no distension. There is no abdominal tenderness.  Abdominal obesity  Musculoskeletal:        General: Deformity (Multiple joint contractures secondary to cerebral palsy) present. No edema.     Cervical back: Neck supple.  Neurological: He is alert and oriented to person,  place, and time. He has intact cranial nerves (2-12).  Skin: Skin is warm and moist.    LAB RESULTS: Chemistry Recent Labs  Lab 01/16/23 1225 01/16/23 1404  NA 137 138  K 4.0 4.0  CL 105 104  CO2 23  --   GLUCOSE 105* 106*  BUN 11 11  CREATININE 0.85 0.70  CALCIUM 8.9  --   GFRNONAA >60  --   ANIONGAP 9  --     Hematology Recent Labs  Lab 01/16/23 1108 01/16/23 1404  WBC 7.4  --   RBC 4.64  --   HGB 14.9 13.6  HCT 43.4 40.0  MCV 93.5  --   MCH 32.1  --   MCHC 34.3  --   RDW 12.7  --   PLT 292  --    High Sensitivity Troponin:   Recent Labs  Lab 01/16/23 1225  TROPONINIHS 6     Cardiac EnzymesNo results for input(s): "TROPONINI" in the last 168 hours. No results for input(s): "TROPIPOC" in the last 168 hours.  BNPNo results for input(s): "BNP", "PROBNP" in the last 168 hours.  DDimer No  results for input(s): "DDIMER" in the last 168 hours.  Hemoglobin A1c: No results found for: "HGBA1C", "MPG" TSH No results for input(s): "TSH" in the last 8760 hours. Lipid Panel No results found for: "CHOL", "HDL", "LDLCALC", "LDLDIRECT", "TRIG", "CHOLHDL" Drugs of Abuse  No results found for: "LABOPIA", "COCAINSCRNUR", "LABBENZ", "AMPHETMU", "THCU", "LABBARB"    CARDIAC DATABASE: EKG: 01/16/2023: Sinus rhythm, 83 bpm, consider old inferior infarct, LVH per voltage criteria, without underlying injury pattern.  01/16/2023: Sinus rhythm, 71 bpm, LVH per voltage criteria, consider old inferior infarct, without underlying injury pattern.  Echocardiogram: ECHO 01/05/2023 (care everywhere) 1. Normal left ventricular cavity size. Mild concentric left ventricular  hypertrophy. There is hyperdynamic left ventricular systolic function. LV  Ejection Fraction is approximately: 75 %. 2. Left ventricular diastolic parameters are consistent with normal diastolic  function. 3. Normal right ventricular size and systolic function. 4. The left atrium is normal in size. 5. There is trivial mitral regurgitation seen. 6. The aortic root is normal in size. The ascending aorta is normal in size.   Heart Catheterization: LHC 01/04/2023  (care everywhere) Left Main: Angiographically normal  LAD: 25% proximal LAD  Long 20-30% in mid LAD after D2 70% long stenosis in proximal D1 Mild disease in distal LAD (<25%) LCx: 25% proximal LCx 60-70% ostial OM1 100% occluded mid LCx RCA: 30% mid RCA Mild disease in distal RCA and PDA (<25%) Hemodynamics:  Left Ventricle: 134/8, end-diastolic pressure 16 mmHg Aorta: 147/90, mean 117 mmHg .  STAGED LHC 01/06/2023  (care everywhere) Successful percutaneous coronary intervention and drug-eluting stent implantation of the first diagonal with reduction of stenosis from 80% to 0%. There was TIMI 3 blood flow pre and postprocedurally.   Scheduled Meds:  aspirin EC  81 mg  Oral Daily   isosorbide mononitrate  30 mg Oral Daily   ticagrelor  90 mg Oral BID    Continuous Infusions:  nitroGLYCERIN 5 mcg/min (01/16/23 1404)    PRN Meds:   IMPRESSION & RECOMMENDATIONS: Kristopher Valdez is a 47 y.o. Caucasian male whose past medical history and cardiovascular risk factors include: Cerebral palsy, hypertension, hyperlipidemia, recent NSTEMI status post PCI x 2 (S/p PCI/DES to LCx 01/04/23 & Staged PCI/DES to D1 01/06/23) , established CAD..  Impression:  Coronary artery disease status post PCI x 2, with precordial pain Recent NSTEMI, April 2024  Hypertension-uncontrolled Hyperlipidemia Cerebral palsy  Plan:  Coronary artery disease status post PCI x 2, with precordial pain: Recent NSTEMI Symptoms are not consistent with anginal chest pain. However, some features are consistent with his anginal equivalents EKG illustrates sinus rhythm without ACS High sensitive troponin is negative x 1, continue to trend troponins Echo will be ordered to evaluate for structural heart disease and left ventricular systolic function. Continue dual antiplatelet therapy Start home medications Start Imdur 30 mg p.o. daily -facilitate antianginal therapy.  Patient is aware of the drug drug interactions between isosorbide and phosphodiesterase 5 inhibitors (erectile dysfunction/BPH medications).  Patient states that he does not take ED/BP medications.  He understands that there are doctor drug interactions which can contribute to further morbidity mortality. Use nitro drip on as needed basis for now. Admit to telemetry floor. Check urine drug screen Check BNP Cycle troponins. Await echo results. Address blood pressures. Repeat EKG if chest pain resurfaces ED physician has ordered CT of the chest dissection protocol-pending  Hypertension: Not well-controlled Home blood pressures are around 160-170 mmHg per patient Restart home medications and monitor BP trend   Hyperlipidemia:  Continue statin therapy  Cerebral palsy: Status post surgeries chronic and stable.  Defer management of other chronic comorbid conditions to primary team.  Plan of care discussed with the patient, his wife was over the phone during this encounter, and ED physician.  Total encounter time 80 minutes. *Total Encounter Time as defined by the Centers for Medicare and Medicaid Services includes, in addition to the face-to-face time of a patient visit (documented in the note above) non-face-to-face time: obtaining and reviewing outside history, ordering and reviewing medications, tests or procedures, care coordination (communications with other health care professionals or caregivers) and documentation in the medical record.  Patient's questions and concerns were addressed to his satisfaction. He voices understanding of the instructions provided during this encounter.   This note was created using a voice recognition software as a result there may be grammatical errors inadvertently enclosed that do not reflect the nature of this encounter. Every attempt is made to correct such errors.  Delilah Shan [facility]  Pager:  [phone] Office: [phone] 01/16/2023, 4:29 PM

## 2023-01-17 ENCOUNTER — Encounter (HOSPITAL_COMMUNITY): Payer: Self-pay | Admitting: Internal Medicine

## 2023-01-17 DIAGNOSIS — G808 Other cerebral palsy: Secondary | ICD-10-CM | POA: Diagnosis not present

## 2023-01-17 DIAGNOSIS — I10 Essential (primary) hypertension: Secondary | ICD-10-CM

## 2023-01-17 DIAGNOSIS — E782 Mixed hyperlipidemia: Secondary | ICD-10-CM

## 2023-01-17 DIAGNOSIS — R072 Precordial pain: Secondary | ICD-10-CM | POA: Diagnosis not present

## 2023-01-17 DIAGNOSIS — Z955 Presence of coronary angioplasty implant and graft: Secondary | ICD-10-CM

## 2023-01-17 DIAGNOSIS — I2510 Atherosclerotic heart disease of native coronary artery without angina pectoris: Secondary | ICD-10-CM | POA: Diagnosis not present

## 2023-01-17 DIAGNOSIS — R0789 Other chest pain: Secondary | ICD-10-CM | POA: Diagnosis not present

## 2023-01-17 LAB — TROPONIN I (HIGH SENSITIVITY): Troponin I (High Sensitivity): 5 ng/L (ref <18)

## 2023-01-17 LAB — BRAIN NATRIURETIC PEPTIDE: B Natriuretic Peptide: 54.8 pg/mL (ref 0.0–100.0)

## 2023-01-17 MED ORDER — METHOCARBAMOL 500 MG PO TABS
500.0000 mg | ORAL_TABLET | Freq: Once | ORAL | Status: AC
  Administered 2023-01-17: 500 mg via ORAL
  Filled 2023-01-17: qty 1

## 2023-01-17 MED ORDER — ISOSORBIDE MONONITRATE ER 30 MG PO TB24: 30.0000 mg | ORAL_TABLET | Freq: Every day | ORAL | 2 refills | Status: DC

## 2023-01-17 MED ORDER — METHOCARBAMOL 500 MG PO TABS: 500.0000 mg | ORAL_TABLET | Freq: Three times a day (TID) | ORAL | 0 refills | Status: DC | PRN

## 2023-01-17 NOTE — Progress Notes (Signed)
Subjective:  Feels well, has not had any further chest pain since last night that was felt to be muscle spasm, no other complaints.  Intake/Output from previous day:  I/O last 3 completed shifts: In: 0.5 [I.V.:0.5] Out: -  No intake/output data recorded. Net IO Since Admission: 0.54 mL [01/17/23 0917]  Blood pressure 90/62, pulse 62, temperature 98 F (36.7 C), temperature source Oral, resp. rate 19, height  (1.753 m), weight 117 kg, SpO2 96 %. Physical Exam Constitutional:      Appearance: He is obese.  Neck:     Vascular: No carotid bruit or JVD.  Cardiovascular:     Rate and Rhythm: Normal rate and regular rhythm.     Pulses: Intact distal pulses.     Heart sounds: Normal heart sounds. No murmur heard.    No gallop.  Pulmonary:     Effort: Pulmonary effort is normal.     Breath sounds: Normal breath sounds.  Abdominal:     General: Bowel sounds are normal.     Palpations: Abdomen is soft.  Musculoskeletal:     Right lower leg: No edema.     Left lower leg: No edema.     Lab Results: Lab Results  Component Value Date   NA 138 01/16/2023   K 4.0 01/16/2023   CO2 23 01/16/2023   GLUCOSE 106 (H) 01/16/2023   BUN 11 01/16/2023   CREATININE 0.70 01/16/2023   CALCIUM 8.9 01/16/2023   GFRNONAA >60 01/16/2023    BNP (last 3 results) Recent Labs    01/16/23 2329  BNP 54.8       Latest Ref Rng & Units 01/16/2023    2:04 PM 01/16/2023   12:25 PM 01/26/2013    9:36 AM  BMP  Glucose 70 - 99 mg/dL 161  096  88   BUN 6 - 20 mg/dL Creatinine 0.61 - 1.24 mg/dL 0.45  4.09  8.11   Sodium 135 - 145 mmol/L 138  137  138   Potassium 3.5 - 5.1 mmol/L 4.0  4.0  4.4   Chloride 98 - 111 mmol/L 104  105  104   CO2 22 - 32 mmol/L  23  27   Calcium 8.9 - 10.3 mg/dL  8.9  9.3        No data to display            Latest Ref Rng & Units 01/16/2023    2:04 PM 01/16/2023   11:08 AM 01/21/2013   10:31 AM  CBC  WBC 4.0 - 10.5 K/uL  7.4  5.7   Hemoglobin 13.0  - 17.0 g/dL 91.4  78.2  95.6   Hematocrit 39.0 - 52.0 % 40.0  43.4  42.1   Platelets 150 - 400 K/uL  292  191    Lipid Panel  No results found for: "CHOL", "TRIG", "HDL", "CHOLHDL", "VLDL", "LDLCALC", "LDLDIRECT" Cardiac Panel (last 3 results) No results for input(s): "CKTOTAL", "CKMB", "TROPONINI", "RELINDX" in the last 72 hours.  HEMOGLOBIN A1C No results found for: "HGBA1C", "MPG" TSH No results for input(s): "TSH" in the last 8760 hours. Imaging:  DG Chest 2 View  01/16/2023 CLINICAL DATA:  Chest pain with left-sided numbness. EXAM: CHEST - 2 VIEW COMPARISON:  Radiographs 05/30/2020 and 02/02/2017.  CT 06/25/2015. FINDINGS: The heart size and mediastinal contours are stable. The lungs are clear. There is no pleural effusion or pneumothorax. No acute osseous findings are identified. Stable minimal  anterior wedging of a midthoracic vertebral body. There are postsurgical changes in the lower cervical spine. IMPRESSION: Stable chest. No active cardiopulmonary process.  Cardiac Studies:  CT angiogram chest and abdomen 01/16/2023: 1. No aortic aneurysm or dissection. Cardiovascular: There is no cardiomegaly or pericardial effusion. There is coronary vascular calcification. The thoracic aorta is unremarkable. The origins of the great vessels of the aortic arch appear patent as visualized. 2. Impacted left lower lobe bronchus with linear and nodular peribronchial densities. Findings may represent mucous impaction, or aspiration. An endobronchial lesion is not excluded. Further evaluation with bronchoscopy or follow-up with CT recommended to document resolution.  3. No acute intra-abdominal or pelvic pathology.   EKG:   EKG 01/16/2023: Normal sinus rhythm at rate of 80 bpm, normal axis, cannot exclude inferior infarct old.  No evidence of ischemia.  Echocardiogram: ECHO 01/05/2023 (care everywhere) 1. Normal left ventricular cavity size. Mild concentric left ventricular  hypertrophy. There is  hyperdynamic left ventricular systolic function. LV  Ejection Fraction is approximately: 75 %. 2. Left ventricular diastolic parameters are consistent with normal diastolic  function. 3. Normal right ventricular size and systolic function. 4. The left atrium is normal in size. 5. There is trivial mitral regurgitation seen. 6. The aortic root is normal in size. The ascending aorta is normal in size.    Heart Catheterization: LHC 01/04/2023  (care everywhere) Left Main: Angiographically normal  LAD: 25% proximal LAD  Long 20-30% in mid LAD after D2 70% long stenosis in proximal D1 Mild disease in distal LAD (<25%) LCx: 25% proximal LCx 60-70% ostial OM1 100% occluded mid LCx RCA: 30% mid RCA Mild disease in distal RCA and PDA (<25%) Hemodynamics:  Left Ventricle: 134/8, end-diastolic pressure 16 mmHg Aorta: 147/90, mean 117 mmHg .   STAGED LHC 01/06/2023  (care everywhere) Successful percutaneous coronary intervention and drug-eluting stent implantation of the first diagonal with reduction of stenosis from 80% to 0%. There was TIMI 3 blood flow pre and postprocedurally.   No results found for this or any previous visit (from the past 75643 hour(s)).  Scheduled Meds:  aspirin EC  81 mg Oral Daily   atorvastatin  80 mg Oral Daily   carvedilol  6.25 mg Oral BID WC   enoxaparin (LOVENOX) injection  40 mg Subcutaneous Q24H   isosorbide mononitrate  30 mg Oral Daily   losartan  50 mg Oral Daily   ticagrelor  90 mg Oral BID   Continuous Infusions: PRN Meds:.acetaminophen, nitroGLYCERIN, nitroGLYCERIN, ondansetron (ZOFRAN) IV  Assessment  Kristopher Valdez is a 47 y.o. male patient with coronary disease, cerebral palsy, admitted with chest pain felt to be musculoskeletal.  1.  Musculoskeletal chest pain. 2.  Coronary artery disease of native vessel without angina pectoris. 3.  Recent myocardial infarction 01/04/2023  Plan:   I discussed with the patient that his chest pain is clearly  musculoskeletal, no Further cardiac evaluation is indicated.  Patient be scheduled to be established in [place] with [facility] heart care.  She can be discharged home with present medications and continue DAPT.    Yates Decamp, MD, [facility] 01/17/2023, 9:17 AM Office: [phone] Fax: [phone] Pager: [phone]

## 2023-01-17 NOTE — Plan of Care (Signed)
  Problem: Education: Goal: Understanding of cardiac disease, CV risk reduction, and recovery process will improve Outcome: Adequate for Discharge Goal: Individualized Educational Video(s) Outcome: Adequate for Discharge   Problem: Activity: Goal: Ability to tolerate increased activity will improve Outcome: Adequate for Discharge   Problem: Cardiac: Goal: Ability to achieve and maintain adequate cardiovascular perfusion will improve Outcome: Adequate for Discharge   Problem: Health Behavior/Discharge Planning: Goal: Ability to safely manage health-related needs after discharge will improve Outcome: Adequate for Discharge   Problem: Education: Goal: Knowledge of General Education information will improve Description: Including pain rating scale, medication(s)/side effects and non-pharmacologic comfort measures Outcome: Adequate for Discharge   Problem: Health Behavior/Discharge Planning: Goal: Ability to manage health-related needs will improve Outcome: Adequate for Discharge   Problem: Clinical Measurements: Goal: Ability to maintain clinical measurements within normal limits will improve Outcome: Adequate for Discharge Goal: Will remain free from infection Outcome: Adequate for Discharge Goal: Diagnostic test results will improve Outcome: Adequate for Discharge Goal: Respiratory complications will improve Outcome: Adequate for Discharge Goal: Cardiovascular complication will be avoided Outcome: Adequate for Discharge   Problem: Activity: Goal: Risk for activity intolerance will decrease Outcome: Adequate for Discharge   Problem: Nutrition: Goal: Adequate nutrition will be maintained Outcome: Adequate for Discharge   Problem: Coping: Goal: Level of anxiety will decrease Outcome: Adequate for Discharge   Problem: Elimination: Goal: Will not experience complications related to bowel motility Outcome: Adequate for Discharge Goal: Will not experience complications  related to urinary retention Outcome: Adequate for Discharge   Problem: Pain Managment: Goal: General experience of comfort will improve Outcome: Adequate for Discharge   Problem: Safety: Goal: Ability to remain free from injury will improve Outcome: Adequate for Discharge   Problem: Skin Integrity: Goal: Risk for impaired skin integrity will decrease Outcome: Adequate for Discharge   

## 2023-01-17 NOTE — Progress Notes (Addendum)
Received a call from bedside RN regarding the patient having intermittent moderate to severe chest pain.  Presented at bedside.  After several queries, the patient reports his chest pain feels like muscle spasms. He has a history of cerebral palsy and was previously on Robaxin.  1 dose of Tramadol given earlier.  1 dose of Robaxin 500 mg x1 added.  The patient has been compliant with his home ASA and Brilinta.  High sensitivity troponins are negative x2.  No evidence of acute ischemia seen on 12 lead EKG, personally reviewed.  We will continue to closely monitor and treat as indicated.  Time: 15 minutes

## 2023-01-18 NOTE — Discharge Summary (Signed)
Physician Discharge Summary   Patient: Kristopher Valdez MRN: [id number] DOB: 04/19/1976  Admit date:     01/16/2023  Discharge date: 01/17/2023  Discharge Physician: Kathlen Mody   PCP: Paulina Fusi, MD   Recommendations at discharge:  Please follow up with PCP Ino ne week.  Please follow up with cardiology as scheduled.   Discharge Diagnoses: Principal Problem:   Chest pain Active Problems:   Precordial pain   Atherosclerotic heart disease   S/P primary angioplasty with coronary stent   Hx of non-ST elevation myocardial infarction (NSTEMI)   Benign hypertension   Mixed hyperlipidemia   Cerebral palsy    Hospital Course:   Kristopher Valdez is a 47 y.o. male with medical history significant of CAD, NSTEMI on 01/04/23 s/p LCx and LAD stenting on aspirin and Brilinta, HTN, HLD, remote history of seizure disorder, obesity, presented with chest pain.   Assessment and Plan:  Atypical chest pain:  Probably musculoskeletal pain:  Recent MI on 01/04/23.   On 4/9 Successful percutaneous coronary intervention and drug-eluting stent implantation of the first diagonal with reduction of stenosis from 80% to 0%. There was TIMI 3 blood flow pre and postprocedurally.  Continue with aspirin and brillinta and statin.   HTN, better controlled.    CAD -As above   Obesity -BMI=38 -Calorie control recommended -Outpatient sleep study to rule out OSA    Consultants: cardiology. Procedures performed: echocardiogram.   Disposition: Home Diet recommendation:  Cardiac diet DISCHARGE MEDICATION: Allergies as of 01/17/2023   No Active Allergies      Medication List     STOP taking these medications    divalproex 500 MG DR tablet Commonly known as: DEPAKOTE   docusate sodium 100 MG capsule Commonly known as: Colace   ondansetron 4 MG tablet Commonly known as: Zofran   oxyCODONE-acetaminophen 10-325 MG tablet Commonly known as: Percocet   polyethylene glycol powder 17  GM/SCOOP powder Commonly known as: GlycoLax       TAKE these medications    aspirin EC 81 MG tablet Take 81 mg by mouth every 6 (six) hours as needed.   atorvastatin 80 MG tablet Commonly known as: LIPITOR Take 80 mg by mouth daily.   carvedilol 6.25 MG tablet Commonly known as: COREG Take 6.25 mg by mouth 2 (two) times daily with a meal.   isosorbide mononitrate 30 MG 24 hr tablet Commonly known as: IMDUR Take 1 tablet (30 mg total) by mouth daily.   losartan 50 MG tablet Commonly known as: COZAAR Take 50 mg by mouth daily.   methocarbamol 500 MG tablet Commonly known as: Robaxin Take 1 tablet (500 mg total) by mouth 3 (three) times daily as needed.   nitroGLYCERIN 0.4 MG SL tablet Commonly known as: NITROSTAT Place 0.4 mg under the tongue every 5 (five) minutes as needed for chest pain.   ticagrelor 90 MG Tabs tablet Commonly known as: BRILINTA Take 90 mg by mouth 2 (two) times daily.        Follow-up Information     Paulina Fusi, MD. Schedule an appointment as soon as possible for a visit in 1 week(s).   Specialty: Internal Medicine Contact information: [address] [place] [address] [phone]                Discharge Exam: Kristopher Valdez Weights   01/16/23 1108  Weight: 117 kg   General exam: Appears calm and comfortable  Respiratory system: Clear to auscultation. Respiratory  effort normal. Cardiovascular system: S1 & S2 heard, RRR. No JVD, murmurs,  Gastrointestinal system: Abdomen is nondistended, soft and nontender.  Central nervous system: Alert and oriented. No focal neurological deficits. Extremities: Symmetric 5 x 5 power. Skin: No rashes, lesions or ulcers Psychiatry: Judgement and insight appear normal. Mood & affect appropriate.    Condition at discharge: fair  The results of significant diagnostics from this hospitalization (including imaging, microbiology, ancillary and laboratory) are listed below for  reference.   Imaging Studies: CT Angio Chest/Abd/Pel for Dissection W and/or Wo Contrast  Result Date: 01/16/2023 CLINICAL DATA:  Acute aortic syndrome suspected. EXAM: CT ANGIOGRAPHY CHEST, ABDOMEN AND PELVIS TECHNIQUE: Non-contrast CT of the chest was initially obtained. Multidetector CT imaging through the chest, abdomen and pelvis was performed using the standard protocol during bolus administration of intravenous contrast. Multiplanar reconstructed images and MIPs were obtained and reviewed to evaluate the vascular anatomy. RADIATION DOSE REDUCTION: This exam was performed according to the departmental dose-optimization program which includes automated exposure control, adjustment of the mA and/or kV according to patient size and/or use of iterative reconstruction technique. CONTRAST:  OMNIPAQUE IOHEXOL 350 MG/ML SOLN COMPARISON:  CT abdomen pelvis dated 04/23/2022. FINDINGS: CTA CHEST FINDINGS Cardiovascular: There is no cardiomegaly or pericardial effusion. There is coronary vascular calcification. The thoracic aorta is unremarkable. The origins of the great vessels of the aortic arch appear patent as visualized. No pulmonary artery embolus identified. Mediastinum/Nodes: No hilar or mediastinal adenopathy. Small hiatal hernia. The esophagus is grossly unremarkable. No mediastinal fluid collection. Lungs/Pleura: Focal subpleural scarring at the left lung base, present on the prior CT. There is impaction of a left lower lobe bronchus with linear and nodular peribronchial densities. Findings may represent mucous impaction, or aspiration. An endobronchial lesion is not excluded. Further evaluation with bronchoscopy or follow-up with CT recommended6 to document resolution. The right lung is clear. There is no pleural effusion or pneumothorax. The central airways remain patent. Musculoskeletal: No acute osseous pathology. Mild chronic appearing compression fracture of superior endplate of T8, T11, and  T12. Review of the MIP images confirms the above findings. CTA ABDOMEN AND PELVIS FINDINGS VASCULAR Aorta: Normal caliber aorta without aneurysm, dissection, vasculitis or significant stenosis. Celiac: Patent without evidence of aneurysm, dissection, vasculitis or significant stenosis. SMA: Patent without evidence of aneurysm, dissection, vasculitis or significant stenosis. Renals: Both renal arteries are patent without evidence of aneurysm, dissection, vasculitis, fibromuscular dysplasia or significant stenosis. IMA: Patent without evidence of aneurysm, dissection, vasculitis or significant stenosis. Inflow: Patent without evidence of aneurysm, dissection, vasculitis or significant stenosis. Veins: No obvious venous abnormality within the limitations of this arterial phase study. Review of the MIP images confirms the above findings. NON-VASCULAR No intra-abdominal free air or free fluid. Hepatobiliary: The liver is unremarkable. No biliary dilatation. Cholecystectomy. Pancreas: Unremarkable. No pancreatic ductal dilatation or surrounding inflammatory changes. Spleen: Normal in size without focal abnormality. Adrenals/Urinary Tract: The adrenal glands unremarkable. There is no hydronephrosis on either side. The visualized ureters and urinary bladder appear unremarkable. Stomach/Bowel: There is no bowel obstruction or active inflammation. Appendectomy. Lymphatic: No adenopathy. Reproductive: Similar vesicles are grossly unremarkable. Other: Small fat containing umbilical hernia. Musculoskeletal: No acute or significant osseous findings. Review of the MIP images confirms the above findings. IMPRESSION: 1. No aortic aneurysm or dissection. 2. Impacted left lower lobe bronchus with linear and nodular peribronchial densities. Findings may represent mucous impaction, or aspiration. An endobronchial lesion is not excluded. Further evaluation with bronchoscopy or follow-up with CT  recommended to document resolution. 3. No  acute intra-abdominal or pelvic pathology. Electronically Signed   By: Elgie Collard M.D.   On: 01/16/2023 16:05   DG Chest 2 View  Result Date: 01/16/2023 CLINICAL DATA:  Chest pain with left-sided numbness. EXAM: CHEST - 2 VIEW COMPARISON:  Radiographs 05/30/2020 and 02/02/2017.  CT 06/25/2015. FINDINGS: The heart size and mediastinal contours are stable. The lungs are clear. There is no pleural effusion or pneumothorax. No acute osseous findings are identified. Stable minimal anterior wedging of a midthoracic vertebral body. There are postsurgical changes in the lower cervical spine. IMPRESSION: Stable chest. No active cardiopulmonary process. Electronically Signed   By: Carey Bullocks M.D.   On: 01/16/2023 11:54    Microbiology: Results for orders placed or performed during the hospital encounter of 01/21/13  Surgical pcr screen     Status: Abnormal   Collection Time: 01/21/13 10:36 AM   Specimen: Nasal Mucosa; Nasal Swab  Result Value Ref Range Status   MRSA, PCR NEGATIVE NEGATIVE Final   Staphylococcus aureus POSITIVE (A) NEGATIVE Final    Comment:        The Xpert SA Assay (FDA approved for NASAL specimens in patients over 67 years of age), is one component of a comprehensive surveillance program.  Test performance has been validated by [employer] for patients greater than or equal to 84 year old. It is not intended to diagnose infection nor to guide or monitor treatment.    Labs: CBC: Recent Labs  Lab 01/16/23 1108 01/16/23 1404  WBC 7.4  --   NEUTROABS 5.3  --   HGB 14.9 13.6  HCT 43.4 40.0  MCV 93.5  --   PLT 292  --    Basic Metabolic Panel: Recent Labs  Lab 01/16/23 1225 01/16/23 1404  NA 137 138  K 4.0 4.0  CL 105 104  CO2 23  --   GLUCOSE 105* 106*  BUN 11 11  CREATININE 0.85 0.70  CALCIUM 8.9  --    Liver Function Tests: No results for input(s): "AST", "ALT", "ALKPHOS", "BILITOT", "PROT", "ALBUMIN" in the last 168 hours. CBG: No results  for input(s): "GLUCAP" in the last 168 hours.  Discharge time spent: 39 minutes.   Signed: Kathlen Mody, MD [facility] 01/18/2023

## 2023-01-21 DIAGNOSIS — S76911A Strain of unspecified muscles, fascia and tendons at thigh level, right thigh, initial encounter: Secondary | ICD-10-CM | POA: Diagnosis not present

## 2023-01-23 ENCOUNTER — Encounter: Payer: Self-pay | Admitting: Cardiology

## 2023-01-23 ENCOUNTER — Encounter: Payer: Self-pay | Admitting: *Deleted

## 2023-01-23 DIAGNOSIS — G253 Myoclonus: Secondary | ICD-10-CM | POA: Insufficient documentation

## 2023-01-23 DIAGNOSIS — K219 Gastro-esophageal reflux disease without esophagitis: Secondary | ICD-10-CM | POA: Insufficient documentation

## 2023-01-23 DIAGNOSIS — R112 Nausea with vomiting, unspecified: Secondary | ICD-10-CM | POA: Insufficient documentation

## 2023-01-23 DIAGNOSIS — F419 Anxiety disorder, unspecified: Secondary | ICD-10-CM

## 2023-01-23 DIAGNOSIS — Z87442 Personal history of urinary calculi: Secondary | ICD-10-CM | POA: Insufficient documentation

## 2023-01-23 DIAGNOSIS — G4730 Sleep apnea, unspecified: Secondary | ICD-10-CM | POA: Insufficient documentation

## 2023-01-23 DIAGNOSIS — I214 Non-ST elevation (NSTEMI) myocardial infarction: Secondary | ICD-10-CM | POA: Insufficient documentation

## 2023-01-23 DIAGNOSIS — F32A Depression, unspecified: Secondary | ICD-10-CM | POA: Insufficient documentation

## 2023-01-23 DIAGNOSIS — I639 Cerebral infarction, unspecified: Secondary | ICD-10-CM | POA: Insufficient documentation

## 2023-01-23 DIAGNOSIS — K8020 Calculus of gallbladder without cholecystitis without obstruction: Secondary | ICD-10-CM

## 2023-01-23 DIAGNOSIS — G709 Myoneural disorder, unspecified: Secondary | ICD-10-CM | POA: Insufficient documentation

## 2023-01-23 HISTORY — DX: Depression, unspecified: F32.A

## 2023-01-23 HISTORY — DX: Myoclonus: G25.3

## 2023-01-23 HISTORY — DX: Non-ST elevation (NSTEMI) myocardial infarction: I21.4

## 2023-01-23 HISTORY — DX: Anxiety disorder, unspecified: F41.9

## 2023-01-23 HISTORY — DX: Calculus of gallbladder without cholecystitis without obstruction: K80.20

## 2023-01-23 HISTORY — DX: Sleep apnea, unspecified: G47.30

## 2023-01-28 ENCOUNTER — Ambulatory Visit: Payer: 59 | Admitting: Cardiology

## 2023-02-19 DIAGNOSIS — B029 Zoster without complications: Secondary | ICD-10-CM | POA: Diagnosis not present

## 2023-03-05 ENCOUNTER — Ambulatory Visit: Payer: 59 | Admitting: Cardiology

## 2023-03-18 ENCOUNTER — Ambulatory Visit: Payer: 59 | Attending: Cardiology | Admitting: Cardiology

## 2023-03-18 ENCOUNTER — Encounter: Payer: Self-pay | Admitting: Cardiology

## 2023-03-18 VITALS — BP 140/88 | HR 78 | Ht 69.0 in | Wt 260.8 lb

## 2023-03-18 DIAGNOSIS — I2510 Atherosclerotic heart disease of native coronary artery without angina pectoris: Secondary | ICD-10-CM

## 2023-03-18 DIAGNOSIS — E782 Mixed hyperlipidemia: Secondary | ICD-10-CM

## 2023-03-18 DIAGNOSIS — I214 Non-ST elevation (NSTEMI) myocardial infarction: Secondary | ICD-10-CM | POA: Diagnosis not present

## 2023-03-18 DIAGNOSIS — G808 Other cerebral palsy: Secondary | ICD-10-CM

## 2023-03-18 DIAGNOSIS — G4733 Obstructive sleep apnea (adult) (pediatric): Secondary | ICD-10-CM | POA: Diagnosis not present

## 2023-03-18 DIAGNOSIS — I10 Essential (primary) hypertension: Secondary | ICD-10-CM

## 2023-03-18 LAB — TROPONIN T: Troponin T (Highly Sensitive): 7 ng/L (ref 0–22)

## 2023-03-18 MED ORDER — ISOSORBIDE MONONITRATE ER 30 MG PO TB24: 30.0000 mg | ORAL_TABLET | Freq: Every day | ORAL | 3 refills | Status: DC

## 2023-03-18 NOTE — Patient Instructions (Addendum)
Medication Instructions:   START: Imdur 30mg  1 tablet daily   Lab Work: Troponin- today If you have labs (blood work) drawn today and your tests are completely normal, you will receive your results only by: MyChart Message (if you have MyChart) OR A paper copy in the mail If you have any lab test that is abnormal or we need to change your treatment, we will call you to review the results.   Testing/Procedures: None Ordered   Follow-Up: At [facility], you and your health needs are our priority.  As part of our continuing mission to provide you with exceptional heart care, we have created designated Provider Care Teams.  These Care Teams include your primary Cardiologist (physician) and Advanced Practice Providers (APPs -  Physician Assistants and Nurse Practitioners) who all work together to provide you with the care you need, when you need it.  We recommend signing up for the patient portal called "MyChart".  Sign up information is provided on this After Visit Summary.  MyChart is used to connect with patients for Virtual Visits (Telemedicine).  Patients are able to view lab/test results, encounter notes, upcoming appointments, etc.  Non-urgent messages can be sent to your provider as well.   To learn more about what you can do with MyChart, go to [email or web address].    Your next appointment:   1 month(s)  The format for your next appointment:   In Person  Provider:   Gypsy Balsam, MD    Other Instructions Will refer to Cardiac Rehab at [facility]- They will call for appt.   [facility] Sleep Study- will call when to begin test after insurance approves

## 2023-03-18 NOTE — Progress Notes (Signed)
Cardiology Consultation:    Date:  03/18/2023   ID:  Kristopher Valdez, DOB March 11, 1976, MRN [id number]  PCP:  Paulina Fusi, MD  Cardiologist:  Gypsy Balsam, MD   Referring MD: Paulina Fusi, MD   Chief Complaint  Patient presents with   Follow-up    Heart attack    History of Present Illness:    Kristopher Valdez is a 47 y.o. male who is being seen today for the evaluation of I had myocardial infarction at the request of Paulina Fusi, MD. past medical history significant for essential hypertension, dyslipidemia, he never smoked, cerebral palsy, family history of premature coronary artery disease he went for vacation just weekend in April 2 [facility] and then started having chest pain and up going to the hospital he was found to have acute myocardial infarction, cardiac catheterization has been performed.  Stent was placed and completely occluded circumflex artery and that he did have staged procedure 2 days later to put a stent in the diagonal branch.  Likely his left ventricle ejection fraction was preserved.  He comes today to my office to be established as outpatient.  Interestingly he described about a week ago he was sitting out some graduation started having chest sensation somewhat similar to the sensation he had before lasted for about 2 hours and after that he feels weak tired exhausted but no more chest pain.  He tried to walk I will be more active but fatigue tiredness make it difficult.  He never smoked does have multiple family members with coronary disease and that he is not on a special diet  Past Medical History:  Diagnosis Date   Anxiety 01/23/2023   Arthritis    'all over" (Feb 22, 2013)   Atherosclerotic heart disease 01/16/2023   Benign hypertension 01/16/2023   Cerebral palsy (HCC)    Cholecystitis 11/28/2015   Chronic back pain greater than 3 months duration    Concussion ~ 2007   "related to seizures; happened twice; don't know if LOC" (Feb 22, 2013)    COVID 07/10/2020   Depression 01/23/2023   Epilepsy (HCC)    "all but the 2 in ~ 2007 happen when I'm asleep" (2013/02/22)   Family history of heart disease 06/26/2015   Focal motor seizure (HCC) 08/26/2016   Gallstone 01/23/2023   GERD (gastroesophageal reflux disease)    takes tums prn   Hemorrhoid    History of vertebral compression fracture 06/26/2015   Hx of non-ST elevation myocardial infarction (NSTEMI) 01/16/2023   Kidney stones    "passed on own" (02/22/13)   Migraines    "monthly" (02/22/13)   Mixed hyperlipidemia 01/16/2023   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   S/P primary angioplasty with coronary stent 01/16/2023   Shortness of breath    with exertion   Sleep apnea 01/23/2023   Stroke (HCC) ~ 2005   mild stroke, has short term memory loss    Past Surgical History:  Procedure Laterality Date   ANTERIOR CERVICAL DECOMP/DISCECTOMY FUSION  Feb 22, 2013   ANTERIOR CERVICAL DECOMP/DISCECTOMY FUSION N/A 02/22/13   Procedure: ANTERIOR CERVICAL DECOMPRESSION/DISCECTOMY FUSION 1 LEVEL C4-5;  Surgeon: Venita Lick, MD;  Location: [facility] OR;  Service: Orthopedics;  Laterality: N/A;   APPENDECTOMY  ~ 2010   CHOLECYSTECTOMY  11/28/2015   CORONARY ANGIOPLASTY  01/04/2023  with Des to Lcx and DES to 1st diagonal on 01/06/23 at [facility] in [place]   TENDON LENGTHENING Right ~ 1988   Arm and leg.   VASECTOMY  09/29/2005    Current Medications: Current Meds  Medication Sig   aspirin EC 81 MG tablet Take 81 mg by mouth daily. Swallow whole.   atorvastatin (LIPITOR) 80 MG tablet Take 80 mg by mouth at bedtime.   carvedilol (COREG) 6.25 MG tablet Take 6.25 mg by mouth 2 (two) times daily with a meal.   losartan (COZAAR) 50 MG tablet Take 50 mg by mouth daily.   pantoprazole (PROTONIX) 40 MG tablet Take  40 mg by mouth daily.   ticagrelor (BRILINTA) 90 MG TABS tablet Take 90 mg by mouth 2 (two) times daily.     Allergies:   Patient has no known allergies.   Social History   Socioeconomic History   Marital status: Married    Spouse name: Not on file   Number of children: Not on file   Years of education: Not on file   Highest education level: Not on file  Occupational History   Not on file  Tobacco Use   Smoking status: Never   Smokeless tobacco: Former    Types: Snuff, Chew    Quit date: 09/30/1995   Tobacco comments:    01/26/2013 "used chew & snuff ~ 3-4 yrs" (01/26/2013)  Substance and Sexual Activity   Alcohol use: Not Currently    Comment: 01/26/2013 "rarely; 2 beers/yr recently"   Drug use: Not Currently    Types: Marijuana    Comment: 01/26/2013 'last drug use>5 yr ago"   Sexual activity: Yes  Other Topics Concern   Not on file  Social History Narrative   Not on file   Social Determinants of Health   Financial Resource Strain: Not on file  Food Insecurity: No Food Insecurity (01/17/2023)   Hunger Vital Sign    Worried About Running Out of Food in the Last Year: Never true    Ran Out of Food in the Last Year: Never true  Transportation Needs: No Transportation Needs (01/17/2023)   PRAPARE - Administrator, Civil Service (Medical): No    Lack of Transportation (Non-Medical): No  Physical Activity: Not on file  Stress: Not on file  Social Connections: Not on file     Family History: The patient's family history includes Alcohol abuse in his father, maternal grandfather, mother, paternal grandfather, and paternal grandmother; Diabetes in his maternal grandmother and paternal grandmother; Heart attack in his maternal grandfather and maternal uncle; Heart disease in his maternal grandfather and mother; Hyperlipidemia in his maternal grandfather, maternal grandmother, and mother; Hypertension in his maternal grandfather, mother, and paternal grandmother; Skin  cancer in his paternal grandmother. ROS:   Please see the history of present illness.    All 14 point review of systems negative except as described per history of present illness.  EKGs/Labs/Other Studies Reviewed:    The following studies were reviewed today:  LHC 01/04/2023 Left Main: Angiographically normal  LAD: 25% proximal LAD  Long 20-30% in mid LAD after D2 70% long stenosis in proximal D1 Mild disease in distal LAD (<25%) LCx: 25% proximal LCx 60-70% ostial OM1 100% occluded mid LCx RCA: 30% mid RCA Mild disease in distal RCA and PDA (<25%) Hemodynamics:  Left Ventricle: 134/8, end-diastolic pressure 16 mmHg Aorta: 147/90, mean 117 mmHg  ECHO 01/05/2023 1. Normal left ventricular cavity size. Mild concentric left ventricular  hypertrophy. There is hyperdynamic left ventricular systolic function. LV  Ejection Fraction is approximately: 75 %. 2. Left ventricular diastolic parameters are consistent with normal diastolic  function. 3. Normal right ventricular size and systolic function. 4. The left atrium is normal in size. 5. There is trivial mitral regurgitation seen. 6. The aortic root is normal in size. The ascending aorta is normal in size.  STAGED LHC 01/06/2023 Successful percutaneous coronary intervention and drug-eluting stent implantation of the first diagonal with reduction of stenosis from 80% to 0%. There was TIMI 3 blood flow pre and postprocedurally.   EKG:  EKG is  ordered today.  The ekg ordered today demonstrates normal sinus rhythm, criteria for LVH, nonspecific ST segment changes  Recent Labs: 01/16/2023: B Natriuretic Peptide 54.8; BUN 11; Creatinine, Ser 0.70; Hemoglobin 13.6; Platelets 292; Potassium 4.0; Sodium 138  Recent Lipid Panel No results found for: "CHOL", "TRIG", "HDL", "CHOLHDL", "VLDL", "LDLCALC", "LDLDIRECT"  Physical Exam:    VS:  BP (!) 140/88 (BP Location: Left Arm, Patient Position: Sitting)   Pulse 78   Ht 5\' 9"  (1.753 m)   Wt  260 lb 12.8 oz (118.3 kg)   SpO2 96%   BMI 38.51 kg/m     Wt Readings from Last 3 Encounters:  03/18/23 260 lb 12.8 oz (118.3 kg)  01/15/23 258 lb (117 kg)  01/16/23 258 lb (117 kg)     GEN:  Well nourished, well developed in no acute distress HEENT: Normal NECK: No JVD; No carotid bruits LYMPHATICS: No lymphadenopathy CARDIAC: RRR, no murmurs, no rubs, no gallops RESPIRATORY:  Clear to auscultation without rales, wheezing or rhonchi  ABDOMEN: Soft, non-tender, non-distended MUSCULOSKELETAL:  No edema; No deformity  SKIN: Warm and dry NEUROLOGIC:  Alert and oriented x 3 PSYCHIATRIC:  Normal affect   ASSESSMENT:    1. NSTEMI (non-ST elevated myocardial infarction) (HCC)   2. Coronary artery disease involving native coronary artery of native heart without angina pectoris   3. Benign hypertension   4. Obstructive sleep apnea syndrome   5. Other cerebral palsy (HCC)   6. Mixed hyperlipidemia    PLAN:    In order of problems listed above:  Coronary artery disease, status post non-STEMI, status post PTCA and stenting in 2 circumflex artery which was done in April of this year.  He is on dual antiplatelet therapy aspirin and Brilinta have no difficulty tolerating it.  However he did have episode of chest pain about a week ago.  I will ask him to have troponin I drawn.  Likely EKG did not show any acute changes.  He is also on losartan which I will continue.  Coreg 6.25 twice daily which I will continue for now.  I will add Imdur 30 mg daily to his medical regiment for that he had episode of chest pain.  He does have nitroglycerin asking to use it as needed.  I also asked him to let me know if he required some nitroglycerin.  If 3 nitroglycerin does not relieve the pain him to go to the emergency room. Benign essential hypertension.  First visit to my office blood pressure 140/88 elevated will initiate Imdur which should help with the blood pressure. Dyslipidemia he is on Lipitor 80  which I will continue I will check his fasting lipid profile I will also check LP(a). He will be referred to cardiac rehab in our hospital. He does snore a lot his wife was with him in the room said that he is  falling asleep very easily, it medevac if sleep while waiting for me in the room.  Will schedule him to have home sleep study   Medication Adjustments/Labs and Tests Ordered: Current medicines are reviewed at length with the patient today.  Concerns regarding medicines are outlined above.  Orders Placed This Encounter  Procedures   EKG 12-Lead   No orders of the defined types were placed in this encounter.   Signed, Georgeanna Lea, MD, [facility]. 03/18/2023 3:42 PM    [facility]

## 2023-03-25 ENCOUNTER — Telehealth: Payer: Self-pay

## 2023-03-25 NOTE — Telephone Encounter (Signed)
-----   Message from Georgeanna Lea, MD sent at 03/22/2023 10:14 AM EDT ----- Troponin I negative

## 2023-03-25 NOTE — Telephone Encounter (Signed)
Patient notified of results.

## 2023-04-15 DIAGNOSIS — G809 Cerebral palsy, unspecified: Secondary | ICD-10-CM | POA: Diagnosis not present

## 2023-04-15 DIAGNOSIS — R9431 Abnormal electrocardiogram [ECG] [EKG]: Secondary | ICD-10-CM | POA: Diagnosis not present

## 2023-04-15 DIAGNOSIS — Z79899 Other long term (current) drug therapy: Secondary | ICD-10-CM | POA: Diagnosis not present

## 2023-04-15 DIAGNOSIS — R0989 Other specified symptoms and signs involving the circulatory and respiratory systems: Secondary | ICD-10-CM | POA: Diagnosis not present

## 2023-04-15 DIAGNOSIS — I200 Unstable angina: Secondary | ICD-10-CM | POA: Diagnosis not present

## 2023-04-15 DIAGNOSIS — I160 Hypertensive urgency: Secondary | ICD-10-CM | POA: Diagnosis not present

## 2023-04-15 DIAGNOSIS — E785 Hyperlipidemia, unspecified: Secondary | ICD-10-CM | POA: Diagnosis not present

## 2023-04-15 DIAGNOSIS — I252 Old myocardial infarction: Secondary | ICD-10-CM | POA: Diagnosis not present

## 2023-04-15 DIAGNOSIS — R079 Chest pain, unspecified: Secondary | ICD-10-CM | POA: Diagnosis not present

## 2023-04-15 DIAGNOSIS — I10 Essential (primary) hypertension: Secondary | ICD-10-CM | POA: Diagnosis not present

## 2023-04-15 DIAGNOSIS — Z955 Presence of coronary angioplasty implant and graft: Secondary | ICD-10-CM | POA: Diagnosis not present

## 2023-04-15 DIAGNOSIS — I25110 Atherosclerotic heart disease of native coronary artery with unstable angina pectoris: Secondary | ICD-10-CM | POA: Diagnosis not present

## 2023-04-15 DIAGNOSIS — Z7902 Long term (current) use of antithrombotics/antiplatelets: Secondary | ICD-10-CM | POA: Diagnosis not present

## 2023-04-16 DIAGNOSIS — Z0489 Encounter for examination and observation for other specified reasons: Secondary | ICD-10-CM | POA: Diagnosis not present

## 2023-04-16 DIAGNOSIS — I25110 Atherosclerotic heart disease of native coronary artery with unstable angina pectoris: Secondary | ICD-10-CM | POA: Diagnosis not present

## 2023-04-16 DIAGNOSIS — Z955 Presence of coronary angioplasty implant and graft: Secondary | ICD-10-CM | POA: Diagnosis not present

## 2023-04-16 DIAGNOSIS — R079 Chest pain, unspecified: Secondary | ICD-10-CM | POA: Diagnosis not present

## 2023-04-16 DIAGNOSIS — I200 Unstable angina: Secondary | ICD-10-CM | POA: Diagnosis not present

## 2023-04-16 NOTE — Progress Notes (Signed)
Cardiology Office Note:  .   Date:  04/17/2023  ID:  Jackelyn Knife, DOB [birth year], MRN [id number] PCP: Paulina Fusi, MD  [facility] Providers Cardiologist:  None    History of Present Illness: .   Kristopher Valdez is a 47 y.o. male with a past medical history of CAD PTCA and DES of circumflex in April 2024, hypertension, stroke, sleep apnea, cerebral palsy, hyperlipidemia, anxiety.  Most recently evaluated by Dr. Bing Matter on 03/18/2023 to establish care for coronary artery disease.  He reported a week ago he had an episode of chest pain similar to when he had his MI.  Imdur was added to his medication regimen.  He presented to the ED on 7/17/ with chest pain, decision was made to take him for a left heart catheterization which was unchanged.  He presents today for follow up of his CAD. He is relieved that his repeat LHC was normal, but he continues to have chest pain that is similar to how he initially presented with his MI. He feels he is back to his baseline energy wise, we discussed referring to cardiac rehab but he does not feel it is necessary at this time. He was given an Itamar home sleep study but never heard back. He denies chest pain, palpitations, dyspnea, pnd, orthopnea, n, v, dizziness, syncope, edema, weight gain, or early satiety.  ROS: Review of Systems  Constitutional: Negative.   HENT: Negative.    Eyes: Negative.   Respiratory: Negative.    Cardiovascular:  Positive for chest pain.  Gastrointestinal:  Positive for heartburn.  Genitourinary: Negative.   Musculoskeletal: Negative.   Skin: Negative.   Neurological: Negative.   Endo/Heme/Allergies: Negative.   Psychiatric/Behavioral: Negative.      Studies Reviewed: .            Risk Assessment/Calculations:     HYPERTENSION CONTROL Vitals:   04/17/23 1518 04/17/23 1547  BP: (!) 148/90 (!) 148/90    The patient's blood pressure is elevated above target today.  In order to address the patient's  elevated BP: A current anti-hypertensive medication was adjusted today.          Physical Exam:   VS:  BP (!) 148/90   Pulse 90   Ht 5\' 9"  (1.753 m)   Wt 267 lb 6.4 oz (121.3 kg)   SpO2 99%   BMI 39.49 kg/m    Wt Readings from Last 3 Encounters:  04/17/23 267 lb 6.4 oz (121.3 kg)  03/18/23 260 lb 12.8 oz (118.3 kg)  01/15/23 258 lb (117 kg)    GEN: Well nourished, well developed in no acute distress NECK: No JVD; No carotid bruits CARDIAC: RRR, no murmurs, rubs, gallops RESPIRATORY:  Clear to auscultation without rales, wheezing or rhonchi  ABDOMEN: Soft, non-tender, non-distended EXTREMITIES:  No edema; No deformity   ASSESSMENT AND PLAN: .   CAD - s/p PTCA and DES of circumflex in April 2024; repeat LHC yesterday revealed patent DES. CP was felt to be due to GERD, however pt feels that is not the cause. Will start him on amlodipine to see if this helps any. Continue ASA, Brilinta, Lipitor, Imdur, Coreg, NTG PRN. LHC site with +2 pulse strength proximal to and distal from insertion site.  HTN - BP is elevated today at 148/90, will start Norvasc 2.5 mg daily, continue Cozaar 50 mg daily.  HLD - currently on Lipitor which we will continue. Appears his PCP is managing his lipids. Suspected OSA -  Itamar home sleep study was sent home last month, still waiting for PA before he can proceed with testing.         Dispo: Start amlodipine 2.5 mg daily, Follow up in 3 months.   Signed, Flossie Dibble, NP

## 2023-04-17 ENCOUNTER — Encounter: Payer: Self-pay | Admitting: Cardiology

## 2023-04-17 ENCOUNTER — Ambulatory Visit: Payer: 59 | Attending: Cardiology | Admitting: Cardiology

## 2023-04-17 VITALS — BP 148/90 | HR 90 | Ht 69.0 in | Wt 267.4 lb

## 2023-04-17 DIAGNOSIS — I214 Non-ST elevation (NSTEMI) myocardial infarction: Secondary | ICD-10-CM

## 2023-04-17 DIAGNOSIS — I10 Essential (primary) hypertension: Secondary | ICD-10-CM | POA: Diagnosis not present

## 2023-04-17 DIAGNOSIS — G4730 Sleep apnea, unspecified: Secondary | ICD-10-CM | POA: Diagnosis not present

## 2023-04-17 DIAGNOSIS — I2510 Atherosclerotic heart disease of native coronary artery without angina pectoris: Secondary | ICD-10-CM | POA: Diagnosis not present

## 2023-04-17 DIAGNOSIS — E782 Mixed hyperlipidemia: Secondary | ICD-10-CM

## 2023-04-17 MED ORDER — CARVEDILOL 6.25 MG PO TABS: 6.2500 mg | ORAL_TABLET | Freq: Two times a day (BID) | ORAL | 11 refills | Status: AC

## 2023-04-17 MED ORDER — PANTOPRAZOLE SODIUM 40 MG PO TBEC: 40.0000 mg | DELAYED_RELEASE_TABLET | Freq: Every day | ORAL | 3 refills | Status: AC

## 2023-04-17 MED ORDER — TICAGRELOR 90 MG PO TABS: 90.0000 mg | ORAL_TABLET | Freq: Two times a day (BID) | ORAL | 11 refills | Status: AC

## 2023-04-17 MED ORDER — LOSARTAN POTASSIUM 50 MG PO TABS: 50.0000 mg | ORAL_TABLET | Freq: Every day | ORAL | 3 refills | Status: AC

## 2023-04-17 MED ORDER — ASPIRIN 81 MG PO TBEC: 81.0000 mg | DELAYED_RELEASE_TABLET | Freq: Every day | ORAL | 3 refills | Status: AC

## 2023-04-17 MED ORDER — AMLODIPINE BESYLATE 2.5 MG PO TABS: 2.5000 mg | ORAL_TABLET | Freq: Every day | ORAL | 3 refills | Status: DC

## 2023-04-17 MED ORDER — AMLODIPINE BESYLATE 2.5 MG PO TABS: 2.5000 mg | ORAL_TABLET | Freq: Every day | ORAL | 3 refills | Status: AC

## 2023-04-17 MED ORDER — NITROGLYCERIN 0.4 MG SL SUBL: 0.4000 mg | SUBLINGUAL_TABLET | SUBLINGUAL | 3 refills | Status: DC | PRN

## 2023-04-17 MED ORDER — ISOSORBIDE MONONITRATE ER 30 MG PO TB24: 30.0000 mg | ORAL_TABLET | Freq: Every day | ORAL | 3 refills | Status: AC

## 2023-04-17 MED ORDER — ATORVASTATIN CALCIUM 80 MG PO TABS: 80.0000 mg | ORAL_TABLET | Freq: Every day | ORAL | 3 refills | Status: AC

## 2023-04-17 NOTE — Patient Instructions (Signed)
Medication Instructions:  Your physician has recommended you make the following change in your medication:  Start Amlodipine 2.5 mg once daily  *If you need a refill on your cardiac medications before your next appointment, please call your pharmacy*   Lab Work: NONE If you have labs (blood work) drawn today and your tests are completely normal, you will receive your results only by: MyChart Message (if you have MyChart) OR A paper copy in the mail If you have any lab test that is abnormal or we need to change your treatment, we will call you to review the results.   Testing/Procedures: NONE   Follow-Up: At [facility], you and your health needs are our priority.  As part of our continuing mission to provide you with exceptional heart care, we have created designated Provider Care Teams.  These Care Teams include your primary Cardiologist (physician) and Advanced Practice Providers (APPs -  Physician Assistants and Nurse Practitioners) who all work together to provide you with the care you need, when you need it.  We recommend signing up for the patient portal called "MyChart".  Sign up information is provided on this After Visit Summary.  MyChart is used to connect with patients for Virtual Visits (Telemedicine).  Patients are able to view lab/test results, encounter notes, upcoming appointments, etc.  Non-urgent messages can be sent to your provider as well.   To learn more about what you can do with MyChart, go to [email or web address].    Your next appointment:   3 month(s)  Provider:   Gypsy Balsam, MD    Other Instructions

## 2023-04-20 ENCOUNTER — Telehealth: Payer: Self-pay

## 2023-04-20 NOTE — Telephone Encounter (Signed)
**Note De-Identified Elsbeth Yearick Obfuscation** Per Despina Arias at [facility] DUAL COMPLETE [phone]) a PA for a Itamar-HST is not required. Reference #: [id number]

## 2023-04-21 NOTE — Telephone Encounter (Signed)
Called pt to let him know Donnie Coffin has been authorized. VM full- dh

## 2023-05-11 ENCOUNTER — Encounter (INDEPENDENT_AMBULATORY_CARE_PROVIDER_SITE_OTHER): Payer: 59 | Admitting: Cardiology

## 2023-05-11 DIAGNOSIS — G4733 Obstructive sleep apnea (adult) (pediatric): Secondary | ICD-10-CM | POA: Diagnosis not present

## 2023-05-17 ENCOUNTER — Ambulatory Visit: Payer: 59 | Attending: Cardiology

## 2023-05-17 DIAGNOSIS — G4733 Obstructive sleep apnea (adult) (pediatric): Secondary | ICD-10-CM

## 2023-05-17 NOTE — Procedures (Signed)
Patient Information Study Date: 05/11/2023 Patient Name: Kristopher Valdez Patient ID: [id number] Birth Date: 1975-12-20 Age: 47 Gender: Male BMI: 38.5 (W=260 lb, H=5' 9'') Stopbang:Referring Physician: Gypsy Balsam, MD  TEST DESCRIPTION: Home sleep apnea testing was completed using the WatchPat, a Type 1 device, utilizing peripheral arterial tonometry (PAT), chest movement, actigraphy, pulse oximetry, pulse rate, body position and snore. AHI was calculated with apnea and hypopnea using valid sleep time as the denominator. RDI includes apneas, hypopneas, and RERAs. The data acquired and the scoring of sleep and all associated events were performed in accordance with the recommended standards and specifications as outlined in the AASM Manual for the Scoring of Sleep and Associated Events 2.2.0 (2015).   FINDINGS:   1. Mild Obstructive Sleep Apnea with AHI 8.2/hr overall but moderate during REM sleep with REM AHI 15/hr.   2. No Central Sleep Apnea with pAHIc 0.6/hr.   3. Oxygen desaturations as low as 80%.   4. Mild snoring was present. O2 sats were < 88% for 0.1 min.   5. Total sleep time was 5 hrs and 27 min.   6. 35.6% of total sleep time was spent in REM sleep.   7. Normal sleep onset latency at 16 min.   8. Shortened REM sleep onset latency at 76 min.   9. Total awakenings were 7.  10. Arrhythmia detection:  Suggestive of possible brief atrial fibrillation lasting 41 seconds.  This is not diagnostic and further testing with outpatient telemetry monitoring is recommended.  DIAGNOSIS: Mild Obstructive Sleep Apnea (G47.33) Possible Atrial Fibrillation  RECOMMENDATIONS:   1.  Clinical correlation of these findings is necessary.  The decision to treat obstructive sleep apnea (OSA) is usually based on the presence of apnea symptoms or the presence of associated medical conditions such as Hypertension, Congestive Heart Failure, Atrial Fibrillation or Obesity.  The most common symptoms  of OSA are snoring, gasping for breath while sleeping, daytime sleepiness and fatigue.   2.  Initiating apnea therapy is recommended given the presence of symptoms and/or associated conditions. Recommend proceeding with one of the following:     a.  Auto-CPAP therapy with a pressure range of 5-20cm H2O.     b.  An oral appliance (OA) that can be obtained from certain dentists with expertise in sleep medicine.  These are primarily of use in non-obese patients with mild and moderate disease.     c.  An ENT consultation which may be useful to look for specific causes of obstruction and possible treatment options.     d.  If patient is intolerant to PAP therapy, consider referral to ENT for evaluation for hypoglossal nerve stimulator.   3.  Close follow-up is necessary to ensure success with CPAP or oral appliance therapy for maximum benefit.  4.  A follow-up oximetry study on CPAP is recommended to assess the adequacy of therapy and determine the need for supplemental oxygen or the potential need for Bi-level therapy.  An arterial blood gas to determine the adequacy of baseline ventilation and oxygenation should also be considered.  5.  Healthy sleep recommendations include:  adequate nightly sleep (normal 7-9 hrs/night), avoidance of caffeine after noon and alcohol near bedtime, and maintaining a sleep environment that is cool, dark and quiet.  6.  Weight loss for overweight patients is recommended.  Even modest amounts of weight loss can significantly improve the severity of sleep apnea.  7.  Snoring recommendations include:  weight loss where appropriate, side sleeping,  and avoidance of alcohol before bed.  8.  Operation of motor vehicle should be avoided when sleepy.  Signature: Armanda Magic, MD; [facility]; Diplomat, American Board of Sleep Medicine Electronically Signed: 05/17/2023 7:45:52 PM

## 2023-05-20 ENCOUNTER — Telehealth: Payer: Self-pay

## 2023-05-20 DIAGNOSIS — G4733 Obstructive sleep apnea (adult) (pediatric): Secondary | ICD-10-CM

## 2023-05-20 NOTE — Telephone Encounter (Signed)
Notified patient of sleep study results and recommendations. All questions (if any) were answered. Patient verbalized understanding. CPAP Titration ordered today 05/20/23

## 2023-05-20 NOTE — Telephone Encounter (Signed)
-----   Message from Armanda Magic sent at 05/17/2023  7:47 PM EDT ----- Please let patient know that they have sleep apnea.  Recommend therapeutic CPAP titration for treatment of patient's sleep disordered breathing.  If unable to perform an in lab titration then initiate ResMed auto CPAP from 4 to 15cm H2O with heated humidity and mask of choice and overnight pulse ox on CPAP.

## 2023-07-17 ENCOUNTER — Encounter: Payer: Self-pay | Admitting: Cardiology

## 2023-07-20 ENCOUNTER — Ambulatory Visit: Payer: 59 | Attending: Cardiology | Admitting: Cardiology

## 2023-08-12 ENCOUNTER — Other Ambulatory Visit: Payer: Self-pay | Admitting: Cardiology

## 2023-08-12 ENCOUNTER — Telehealth: Payer: Self-pay

## 2023-08-12 NOTE — Telephone Encounter (Signed)
**Note De-Identified Joshlynn Alfonzo Obfuscation** Per the [facility] Provider Portal: Prior Authorization/Notification is not required for the requested service(s). procedure code 45409 You are not required to submit a notification/prior authorization based on the information provided.  Decision ID #: [id number] Expected from date: 08/25/2023-09/29/2023

## 2023-08-21 ENCOUNTER — Telehealth: Payer: Self-pay | Admitting: *Deleted

## 2023-08-21 MED ORDER — NITROGLYCERIN 0.4 MG SL SUBL: 0.4000 mg | SUBLINGUAL_TABLET | SUBLINGUAL | 3 refills | Status: AC | PRN

## 2023-08-21 NOTE — Telephone Encounter (Signed)
Rx refill sent to pharmacy. 

## 2023-09-14 ENCOUNTER — Encounter (HOSPITAL_BASED_OUTPATIENT_CLINIC_OR_DEPARTMENT_OTHER): Payer: 59 | Admitting: Cardiology

## 2023-10-02 DIAGNOSIS — R9431 Abnormal electrocardiogram [ECG] [EKG]: Secondary | ICD-10-CM | POA: Diagnosis not present

## 2023-10-02 DIAGNOSIS — I498 Other specified cardiac arrhythmias: Secondary | ICD-10-CM | POA: Diagnosis not present

## 2023-10-02 DIAGNOSIS — R000 Tachycardia, unspecified: Secondary | ICD-10-CM | POA: Diagnosis not present

## 2023-10-02 DIAGNOSIS — Z79899 Other long term (current) drug therapy: Secondary | ICD-10-CM | POA: Diagnosis not present

## 2023-10-02 DIAGNOSIS — R918 Other nonspecific abnormal finding of lung field: Secondary | ICD-10-CM | POA: Diagnosis not present

## 2023-10-02 DIAGNOSIS — G809 Cerebral palsy, unspecified: Secondary | ICD-10-CM | POA: Diagnosis not present

## 2023-10-02 DIAGNOSIS — R079 Chest pain, unspecified: Secondary | ICD-10-CM | POA: Diagnosis not present

## 2023-10-02 DIAGNOSIS — I119 Hypertensive heart disease without heart failure: Secondary | ICD-10-CM | POA: Diagnosis not present

## 2023-10-02 DIAGNOSIS — I25119 Atherosclerotic heart disease of native coronary artery with unspecified angina pectoris: Secondary | ICD-10-CM | POA: Diagnosis not present

## 2023-10-02 DIAGNOSIS — M25511 Pain in right shoulder: Secondary | ICD-10-CM | POA: Diagnosis not present

## 2023-10-02 DIAGNOSIS — K219 Gastro-esophageal reflux disease without esophagitis: Secondary | ICD-10-CM | POA: Diagnosis not present

## 2023-10-02 DIAGNOSIS — R404 Transient alteration of awareness: Secondary | ICD-10-CM | POA: Diagnosis not present

## 2023-10-02 DIAGNOSIS — I2510 Atherosclerotic heart disease of native coronary artery without angina pectoris: Secondary | ICD-10-CM | POA: Diagnosis not present

## 2023-10-02 DIAGNOSIS — R072 Precordial pain: Secondary | ICD-10-CM | POA: Diagnosis not present

## 2023-10-02 DIAGNOSIS — R6889 Other general symptoms and signs: Secondary | ICD-10-CM | POA: Diagnosis not present

## 2023-10-02 DIAGNOSIS — G4489 Other headache syndrome: Secondary | ICD-10-CM | POA: Diagnosis not present

## 2023-10-02 DIAGNOSIS — E785 Hyperlipidemia, unspecified: Secondary | ICD-10-CM | POA: Diagnosis not present

## 2023-10-02 DIAGNOSIS — Z955 Presence of coronary angioplasty implant and graft: Secondary | ICD-10-CM | POA: Diagnosis not present

## 2023-10-02 DIAGNOSIS — J479 Bronchiectasis, uncomplicated: Secondary | ICD-10-CM | POA: Diagnosis not present

## 2023-10-02 DIAGNOSIS — J9811 Atelectasis: Secondary | ICD-10-CM | POA: Diagnosis not present

## 2023-10-02 DIAGNOSIS — Z23 Encounter for immunization: Secondary | ICD-10-CM | POA: Diagnosis not present

## 2023-10-02 NOTE — Telephone Encounter (Signed)
**Note De-Identified Ashlon Lottman Obfuscation** From sleep lab,  The pt canceled his upcoming appt for 12/16 he wants to reschedule but he needs an auth extension due to the one on file ending 09/29/23   Per the [facility] Provider Portal: Prior Authorization/Notification is not required for the requested service(s). CPT Code: 04188  You are not required to submit a notification/prior authorization based on the information provided.  Decision ID #: [id number]  I have forwarded the pts CPAP Titration order to the sleep lab.

## 2023-10-03 DIAGNOSIS — R072 Precordial pain: Secondary | ICD-10-CM | POA: Diagnosis not present

## 2023-10-03 DIAGNOSIS — M25511 Pain in right shoulder: Secondary | ICD-10-CM | POA: Diagnosis not present

## 2023-10-03 DIAGNOSIS — J479 Bronchiectasis, uncomplicated: Secondary | ICD-10-CM | POA: Diagnosis not present

## 2023-10-03 DIAGNOSIS — J9811 Atelectasis: Secondary | ICD-10-CM | POA: Diagnosis not present

## 2023-10-03 DIAGNOSIS — R000 Tachycardia, unspecified: Secondary | ICD-10-CM | POA: Diagnosis not present

## 2023-10-03 DIAGNOSIS — R079 Chest pain, unspecified: Secondary | ICD-10-CM | POA: Diagnosis not present

## 2023-10-04 DIAGNOSIS — I499 Cardiac arrhythmia, unspecified: Secondary | ICD-10-CM | POA: Diagnosis not present

## 2023-11-04 DIAGNOSIS — M62838 Other muscle spasm: Secondary | ICD-10-CM | POA: Diagnosis not present

## 2023-11-04 DIAGNOSIS — B029 Zoster without complications: Secondary | ICD-10-CM | POA: Diagnosis not present
